# Patient Record
Sex: Female | Born: 1960 | Race: White | Hispanic: No | Marital: Married
Health system: Southern US, Academic
[De-identification: ages and names within clinical notes are randomized; demographics above are authoritative.]

## PROBLEM LIST (undated history)

## (undated) ENCOUNTER — Encounter

## (undated) ENCOUNTER — Ambulatory Visit

## (undated) ENCOUNTER — Ambulatory Visit: Payer: MEDICARE

## (undated) ENCOUNTER — Telehealth

## (undated) ENCOUNTER — Encounter: Attending: Anesthesiology | Primary: Anesthesiology

## (undated) ENCOUNTER — Non-Acute Institutional Stay: Payer: MEDICARE

## (undated) ENCOUNTER — Encounter: Attending: Obstetrics & Gynecology | Primary: Obstetrics & Gynecology

## (undated) ENCOUNTER — Ambulatory Visit: Payer: Medicare (Managed Care)

## (undated) DIAGNOSIS — K589 Irritable bowel syndrome without diarrhea: Secondary | ICD-10-CM

## (undated) DIAGNOSIS — C801 Malignant (primary) neoplasm, unspecified: Secondary | ICD-10-CM

## (undated) DIAGNOSIS — J45909 Unspecified asthma, uncomplicated: Secondary | ICD-10-CM

## (undated) DIAGNOSIS — E538 Deficiency of other specified B group vitamins: Secondary | ICD-10-CM

## (undated) DIAGNOSIS — Q798 Other congenital malformations of musculoskeletal system: Secondary | ICD-10-CM

## (undated) DIAGNOSIS — L309 Dermatitis, unspecified: Secondary | ICD-10-CM

## (undated) DIAGNOSIS — R739 Hyperglycemia, unspecified: Secondary | ICD-10-CM

## (undated) DIAGNOSIS — C539 Malignant neoplasm of cervix uteri, unspecified: Secondary | ICD-10-CM

## (undated) DIAGNOSIS — M549 Dorsalgia, unspecified: Secondary | ICD-10-CM

## (undated) DIAGNOSIS — M797 Fibromyalgia: Secondary | ICD-10-CM

## (undated) DIAGNOSIS — M1990 Unspecified osteoarthritis, unspecified site: Secondary | ICD-10-CM

## (undated) DIAGNOSIS — G8929 Other chronic pain: Secondary | ICD-10-CM

## (undated) HISTORY — DX: Irritable bowel syndrome without diarrhea: K58.9

## (undated) HISTORY — PX: BACK SURGERY: SHX140

## (undated) HISTORY — DX: Dermatitis, unspecified: L30.9

## (undated) HISTORY — PX: BREAST SURGERY: SHX581

## (undated) HISTORY — DX: Other chronic pain: G89.29

## (undated) HISTORY — DX: Deficiency of other specified B group vitamins: E53.8

## (undated) HISTORY — DX: Hyperglycemia, unspecified: R73.9

## (undated) HISTORY — DX: Malignant neoplasm of cervix uteri, unspecified: C53.9

## (undated) HISTORY — DX: Dorsalgia, unspecified: M54.9

## (undated) HISTORY — DX: Unspecified asthma, uncomplicated: J45.909

---
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
## 1998-03-05 ENCOUNTER — Other Ambulatory Visit: Admission: RE | Admit: 1998-03-05 | Discharge: 1998-03-05 | Payer: Self-pay | Admitting: Obstetrics and Gynecology

## 1998-11-10 ENCOUNTER — Ambulatory Visit (HOSPITAL_COMMUNITY): Admission: RE | Admit: 1998-11-10 | Discharge: 1998-11-10 | Payer: Self-pay | Admitting: Neurosurgery

## 1998-11-10 ENCOUNTER — Encounter: Payer: Self-pay | Admitting: Neurosurgery

## 1998-11-24 ENCOUNTER — Ambulatory Visit (HOSPITAL_COMMUNITY): Admission: RE | Admit: 1998-11-24 | Discharge: 1998-11-24 | Payer: Self-pay | Admitting: Neurosurgery

## 1998-11-24 ENCOUNTER — Encounter: Payer: Self-pay | Admitting: Neurosurgery

## 1998-12-08 ENCOUNTER — Encounter: Payer: Self-pay | Admitting: Neurosurgery

## 1998-12-08 ENCOUNTER — Ambulatory Visit (HOSPITAL_COMMUNITY): Admission: RE | Admit: 1998-12-08 | Discharge: 1998-12-08 | Payer: Self-pay | Admitting: Neurosurgery

## 1998-12-17 ENCOUNTER — Encounter: Payer: Self-pay | Admitting: Neurosurgery

## 1998-12-19 ENCOUNTER — Inpatient Hospital Stay (HOSPITAL_COMMUNITY): Admission: RE | Admit: 1998-12-19 | Discharge: 1998-12-20 | Payer: Self-pay | Admitting: Neurosurgery

## 1998-12-19 ENCOUNTER — Encounter: Payer: Self-pay | Admitting: Neurosurgery

## 1999-03-12 ENCOUNTER — Other Ambulatory Visit: Admission: RE | Admit: 1999-03-12 | Discharge: 1999-03-12 | Payer: Self-pay | Admitting: Obstetrics and Gynecology

## 1999-07-26 ENCOUNTER — Ambulatory Visit (HOSPITAL_COMMUNITY): Admission: RE | Admit: 1999-07-26 | Discharge: 1999-07-26 | Payer: Self-pay | Admitting: Neurosurgery

## 1999-07-26 ENCOUNTER — Encounter: Payer: Self-pay | Admitting: Neurosurgery

## 2000-03-07 ENCOUNTER — Encounter: Payer: Self-pay | Admitting: Neurosurgery

## 2000-03-07 ENCOUNTER — Ambulatory Visit (HOSPITAL_COMMUNITY): Admission: RE | Admit: 2000-03-07 | Discharge: 2000-03-07 | Payer: Self-pay | Admitting: Neurosurgery

## 2000-03-23 ENCOUNTER — Ambulatory Visit (HOSPITAL_COMMUNITY): Admission: RE | Admit: 2000-03-23 | Discharge: 2000-03-23 | Payer: Self-pay | Admitting: Neurosurgery

## 2000-03-23 ENCOUNTER — Encounter: Payer: Self-pay | Admitting: Neurosurgery

## 2000-04-07 ENCOUNTER — Encounter: Payer: Self-pay | Admitting: Neurosurgery

## 2000-04-07 ENCOUNTER — Ambulatory Visit (HOSPITAL_COMMUNITY): Admission: RE | Admit: 2000-04-07 | Discharge: 2000-04-07 | Payer: Self-pay | Admitting: Neurosurgery

## 2000-04-11 ENCOUNTER — Other Ambulatory Visit: Admission: RE | Admit: 2000-04-11 | Discharge: 2000-04-11 | Payer: Self-pay | Admitting: Obstetrics and Gynecology

## 2000-06-10 ENCOUNTER — Encounter: Admission: RE | Admit: 2000-06-10 | Discharge: 2000-06-10 | Payer: Self-pay | Admitting: Obstetrics and Gynecology

## 2000-06-10 ENCOUNTER — Encounter: Payer: Self-pay | Admitting: Obstetrics and Gynecology

## 2000-08-25 ENCOUNTER — Encounter (INDEPENDENT_AMBULATORY_CARE_PROVIDER_SITE_OTHER): Payer: Self-pay | Admitting: Specialist

## 2000-08-25 ENCOUNTER — Ambulatory Visit (HOSPITAL_COMMUNITY): Admission: RE | Admit: 2000-08-25 | Discharge: 2000-08-25 | Payer: Self-pay | Admitting: Obstetrics and Gynecology

## 2001-07-04 ENCOUNTER — Other Ambulatory Visit: Admission: RE | Admit: 2001-07-04 | Discharge: 2001-07-04 | Payer: Self-pay | Admitting: Obstetrics and Gynecology

## 2001-09-08 ENCOUNTER — Encounter: Admission: RE | Admit: 2001-09-08 | Discharge: 2001-09-08 | Payer: Self-pay | Admitting: Urology

## 2001-09-08 ENCOUNTER — Encounter: Payer: Self-pay | Admitting: Urology

## 2001-09-12 ENCOUNTER — Encounter: Payer: Self-pay | Admitting: Urology

## 2001-09-12 ENCOUNTER — Encounter: Admission: RE | Admit: 2001-09-12 | Discharge: 2001-09-12 | Payer: Self-pay | Admitting: Urology

## 2001-09-22 ENCOUNTER — Ambulatory Visit (HOSPITAL_BASED_OUTPATIENT_CLINIC_OR_DEPARTMENT_OTHER): Admission: RE | Admit: 2001-09-22 | Discharge: 2001-09-22 | Payer: Self-pay | Admitting: Urology

## 2001-09-22 ENCOUNTER — Encounter: Payer: Self-pay | Admitting: Urology

## 2001-11-17 ENCOUNTER — Ambulatory Visit (HOSPITAL_COMMUNITY): Admission: RE | Admit: 2001-11-17 | Discharge: 2001-11-17 | Payer: Self-pay | Admitting: Family Medicine

## 2001-11-17 ENCOUNTER — Encounter: Payer: Self-pay | Admitting: Family Medicine

## 2002-02-16 ENCOUNTER — Ambulatory Visit (HOSPITAL_COMMUNITY): Admission: RE | Admit: 2002-02-16 | Discharge: 2002-02-16 | Payer: Self-pay | Admitting: Unknown Physician Specialty

## 2002-02-16 ENCOUNTER — Encounter: Payer: Self-pay | Admitting: Family Medicine

## 2002-02-27 ENCOUNTER — Ambulatory Visit (HOSPITAL_COMMUNITY): Admission: RE | Admit: 2002-02-27 | Discharge: 2002-02-27 | Payer: Self-pay | Admitting: Family Medicine

## 2002-02-27 ENCOUNTER — Encounter: Payer: Self-pay | Admitting: Family Medicine

## 2006-06-22 ENCOUNTER — Emergency Department (HOSPITAL_COMMUNITY): Admission: EM | Admit: 2006-06-22 | Discharge: 2006-06-22 | Payer: Self-pay | Admitting: Emergency Medicine

## 2007-12-03 ENCOUNTER — Emergency Department (HOSPITAL_COMMUNITY): Admission: EM | Admit: 2007-12-03 | Discharge: 2007-12-04 | Payer: Self-pay | Admitting: Emergency Medicine

## 2009-01-07 IMAGING — CR DG ABDOMEN ACUTE W/ 1V CHEST
3 series · 3 of 3 positions shown · non-contrast
Comparison: Chest 06/22/2006

CLINICAL DATA: *Abdominal pain;

ACUTE ABDOMEN SERIES (ABDOMEN 2 VIEW & CHEST 1 VIEW)

[view not recorded (1 of 3)]
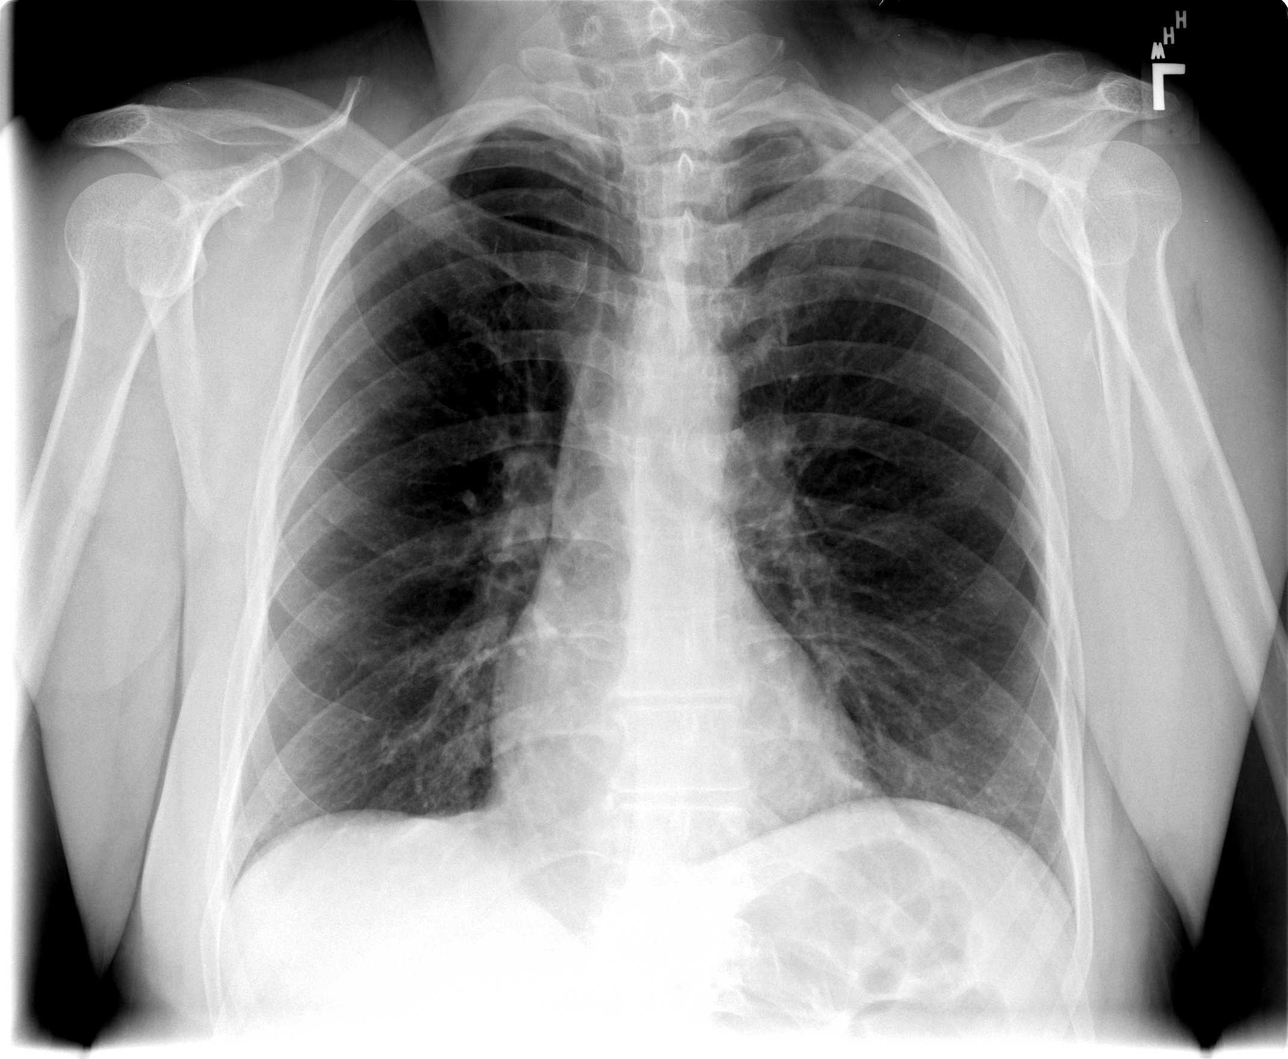

[view not recorded (2 of 3)]
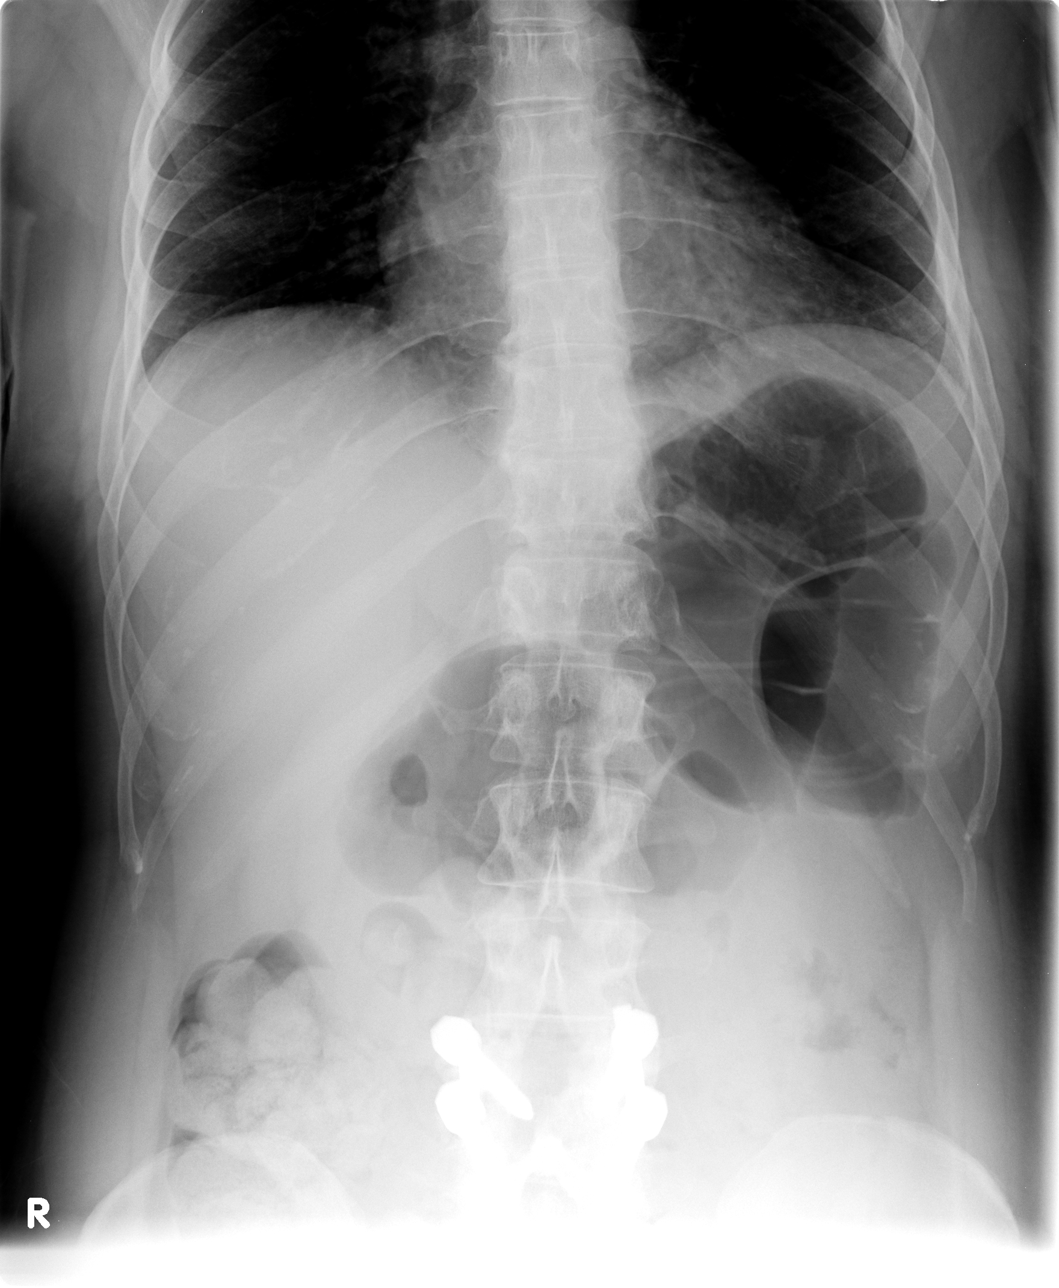

[view not recorded (3 of 3)]
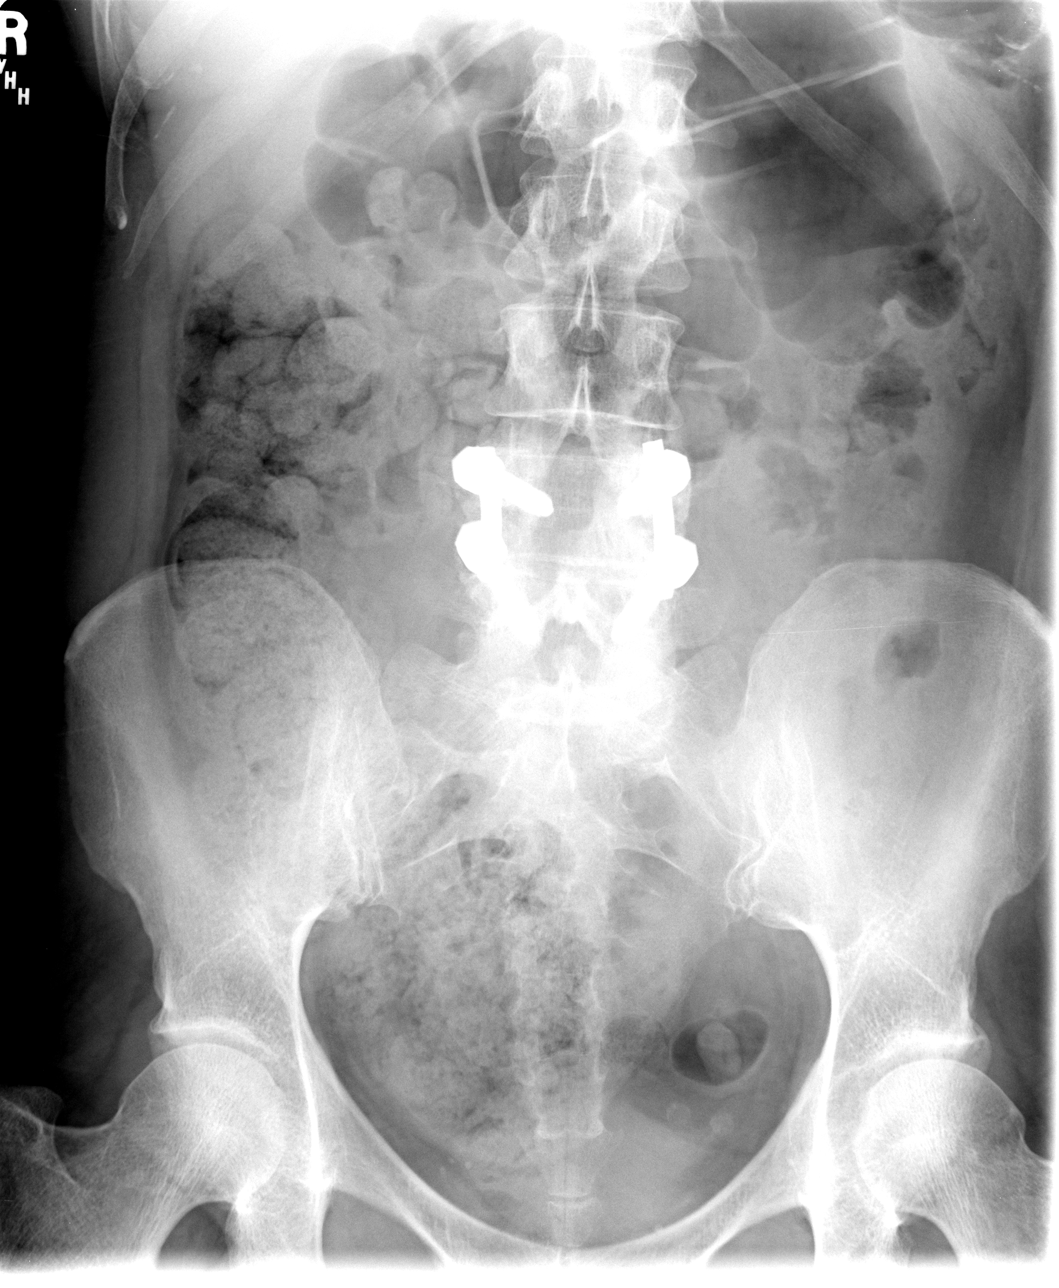

[3 of 3 positions shown; findings below may reference images not displayed]

FINDINGS: The frontal chest film is clear.  Supine and erect
abdomen films show no free air.  Spinal fixation hardware L4-5.
Small bowel decompressed.  Moderate fecal material distends the
cecum and ascending colon, and there is mild gaseous distension of
the splenic flexure; the distal colon appears decompressed.
Bilateral pelvic vascular calcifications.
IMPRESSION: 1.  Moderate proximal colonic fecal material, with a nonobstructive
bowel gas pattern.

## 2010-02-19 ENCOUNTER — Encounter (INDEPENDENT_AMBULATORY_CARE_PROVIDER_SITE_OTHER): Payer: Self-pay | Admitting: *Deleted

## 2010-10-03 ENCOUNTER — Encounter: Payer: Self-pay | Admitting: Family Medicine

## 2010-10-04 ENCOUNTER — Encounter: Payer: Self-pay | Admitting: Family Medicine

## 2010-10-13 NOTE — Letter (Signed)
Summary: New Patient letter  [facility]  [address] [place], [place] [address]   Phone: [phone]  Fax: [phone]       02/19/2010 MRN: [id number]  [facility] [address] [facility] [address] [place], [place]  [address]  Dear [name] [name],  Welcome to the Gastroenterology Division at [facility].    You are scheduled to see Dr. Jarold Motto on 03/24/2010 at 2:00PM on the 3rd floor at [facility], [address]  We ask that you try to arrive at our office 15 minutes prior to your appointment time to allow for check-in.  We would like you to complete the enclosed self-administered evaluation form prior to your visit and bring it with you on the day of your appointment.  We will review it with you.  Also, please bring a complete list of all your medications or, if you prefer, bring the medication bottles and we will list them.  Please bring your insurance card so that we may make a copy of it.  If your insurance requires a referral to see a specialist, please bring your referral form from your primary care physician.  Co-payments are due at the time of your visit and may be paid by cash, check or credit card.     Your office visit will consist of a consult with your physician (includes a physical exam), any laboratory testing he/she may order, scheduling of any necessary diagnostic testing (e.g. x-ray, ultrasound, CT-scan), and scheduling of a procedure (e.g. Endoscopy, Colonoscopy) if required.  Please allow enough time on your schedule to allow for any/all of these possibilities.    If you cannot keep your appointment, please call [phone] to cancel or reschedule prior to your appointment date.  This allows [place] the opportunity to schedule an appointment for another patient in need of care.  If you do not cancel or reschedule by 5 p.m. the business day prior to your appointment date, you will be charged a $50.00 late cancellation/no-show fee.    Thank you for  choosing [facility] for your medical needs.  We appreciate the opportunity to care for you.  Please visit [place] at our website  to learn more about our practice.                     Sincerely,                                                             The Gastroenterology Division

## 2011-01-29 NOTE — Op Note (Signed)
[facility]  Patient:    [name] [name], [name] [name] Visit Number: [id number] MRN: [id number]          Service Type: NES Location: [facility] Attending Physician:  Londell Moh Dictated by:   Jamison Neighbor, M.D. Proc. Date: 09/22/01 Admit Date:  09/22/2001   CC:         Lilyan Punt, M.D. in [place], [place]   Operative Report  PREOPERATIVE DIAGNOSIS:  Recurrent urinary tract infection with associated chronic back pain.  POSTOPERATIVE DIAGNOSIS:  Recurrent urinary tract infection with associated chronic back pain.  PROCEDURE: 1. Cystoscopy. 2. Bilateral retrogrades. 3. Left flexible ureteroscope and left Double J catheter insertion.  SURGEON:  Jamison Neighbor, M.D.  ANESTHESIA:  General.  COMPLICATIONS:  None.  DRAINS:  A 4.5-French Double J catheter.  BRIEF HISTORY:  This 50 year old female has had problems with urgency, frequency, low back pain in what has been thought to be recurrent urinary tract infections. Given the patients history of multiple illnesses, it was certainly thought that this might not be a true chronic cystitis but perhaps interstitial cystitis, but she has had several positive cultures and it is felt that she needs to be worked up for recurrent infection.  Cystoscopy was performed in the office. It was unremarkable. A renal ultrasound was obtained which showed what appeared to be a possible stone in the upper pole of the kidney. A KUB did not show a stone and a CT scan did not show a stone. The patient is very concerned about this finding and has requested that she undergo retrograde studies. The patient will also undergo a left flexible ureteroscopy. The patient understands the risks and benefits of the procedure and gave full and informed consent.  DESCRIPTION OF PROCEDURE:  After successful induction of general anesthesia, the patient was placed in the dorsal lithotomy position, prepped with Betadine and draped in the  usual sterile fashion. A careful bimanual examination revealed no palpable abnormalities of the urethra. There was no real cystocele, rectocele, or enterocele. There were no masses on bimanual exam. Cystoscope was inserted. The bladder was carefully inspected. It was free of any tumor or stones. Both ureteral orifices were normal in configuration and location. Right retrograde study was done. There was a completely normal collecting system with no sign of filling defect. There was no evidence of obstruction and the drain _______ were normal. Retrograde study performed on the left side was also normal. Given the findings described above, however, it was felt that a flexible ureteroscopy would be appropriate. The guidewire was passed up into the kidney and a ureteral access sheath was passed over the guidewire. The flexible ureteroscope was then passed through the ureteral access sheath up into the upper pole. The upper pole was carefully inspected multiple times and no stone was ever visualized. The mid pole and lower pole were also visualized with all portions of the collecting system easily seen. No stones could be seen. There were no tumors or other abnormalities. The guidewire was left in place, the access sheath was removed, and a 4.5-French Double J was then passed up into the kidney and allowed to coil normally in the pelvis. This was left with a string so that the patient can remove this on her own. The patient already has MS Contin so she will not require and additional pain medication. She will be sent home with Pyridium Plus as well as antibiotic prophylaxis with Septra DS one daily. A B&O suppository was given and  the patient was given an injection of lidocaine jelly into the urethra. The patient will be given Zofran and Toradol prior to discharge. Dictated by:   Jamison Neighbor, M.D. Attending Physician:  Londell Moh DD:  09/22/01 TD:  09/23/01 Job:  [id number]

## 2011-01-29 NOTE — H&P (Signed)
[facility]  Patient:    [name] [name]                          MRN: [id number] Adm. Date:  08/25/00 Attending:  Lenoard Aden, M.D.                         History and Physical  CHIEF COMPLAINT:              Dysfunctional uterine bleeding with abnormal saline sonohysterography and desire for elective sterilization.  HISTORY OF PRESENT ILLNESS:   Patient is a 50 year old white female, G4, P2, LMP abnormal, with a history of an abnormal saline sonohysterogram with questionable endometrial polyp, who presents for [facility], hysteroscopy and desire for elective sterilization.  PAST MEDICAL HISTORY:         Past medical history remarkable for breast surgery and saline implants with rupture, history of laser surgery in 1989 and history of two uncomplicated vaginal deliveries and two abortions.  Patient has had multiple surgeries for fixing her saline implants.  History of scarlet fever as a child and occasional asthma.  FAMILY HISTORY:               Family history of hypertension, stomach and throat cancer and heart disease.  SOCIAL HISTORY:               She is a smoker, a pack a day for 15 years.  She denies domestic or physical violence.  MEDICATIONS:                  Celebrex, Baclofen, Zanaflex, Topamax, Lasix, doxycycline, Prevacid, Micronor, B12 injections, Percocet p.r.n., Flonase, Ventolin and Flovent.  ALLERGIES:                    She is allergic to AUGMENTIN and LATEX.  PHYSICAL EXAMINATION  GENERAL:                      She is a thin white female in no apparent distress.  HEENT:                        Normal.  LUNGS:                        Clear.  HEART:                        Regular rhythm.  ABDOMEN:                      Soft, nontender.  PELVIC:                       Exam reveals a uterus which is normal size and anteflexed and no adnexal masses.  IMPRESSION:                   1. Dysfunctional uterine bleeding with defect  on                                  saline sonohysterography.  2. Desire for elective sterilization.  PLAN:                         Plan is to proceed with D&C, hysteroscopy, resectoscope, laparoscopic tubal ligation.  Risks of anesthesia, infection, bleeding, injury to abdominal organs, uterine perforation with need for repair, delayed versus immediate complications to include bowel and bladder injury are noted; patient acknowledges and desires to proceed.  Failure risk of tubal ligation of 5 to 10 per thousand noted. DD:  08/25/00 TD:  08/25/00 Job: [id number]

## 2011-06-07 LAB — URINALYSIS, ROUTINE W REFLEX MICROSCOPIC
Bilirubin Urine: NEGATIVE
Glucose, UA: NEGATIVE
Leukocytes, UA: NEGATIVE
Nitrite: NEGATIVE
Protein, ur: NEGATIVE
Specific Gravity, Urine: >1.03 — ABNORMAL HIGH
Urobilinogen, UA: 0.2
pH: 5

## 2011-06-07 LAB — URINE MICROSCOPIC-ADD ON

## 2011-09-05 ENCOUNTER — Encounter: Payer: Self-pay | Admitting: *Deleted

## 2011-09-05 DIAGNOSIS — Q798 Other congenital malformations of musculoskeletal system: Secondary | ICD-10-CM | POA: Insufficient documentation

## 2011-09-05 DIAGNOSIS — Z859 Personal history of malignant neoplasm, unspecified: Secondary | ICD-10-CM | POA: Insufficient documentation

## 2011-09-05 DIAGNOSIS — J189 Pneumonia, unspecified organism: Secondary | ICD-10-CM | POA: Insufficient documentation

## 2011-09-05 DIAGNOSIS — F17200 Nicotine dependence, unspecified, uncomplicated: Secondary | ICD-10-CM | POA: Insufficient documentation

## 2011-09-05 NOTE — ED Notes (Signed)
Pt has had cough for 5wks. Pt states she vomited 1 time on Monday, Dec. 17th. Pt initially coughing up mucous, now pt states she is coughing up blood tinged mucous.

## 2011-09-06 ENCOUNTER — Encounter (HOSPITAL_COMMUNITY): Payer: Self-pay | Admitting: Emergency Medicine

## 2011-09-06 ENCOUNTER — Emergency Department (HOSPITAL_COMMUNITY)
Admission: EM | Admit: 2011-09-06 | Discharge: 2011-09-06 | Disposition: A | Discharge status: Home / Self Care | Payer: PRIVATE HEALTH INSURANCE | Attending: Emergency Medicine | Admitting: Emergency Medicine

## 2011-09-06 ENCOUNTER — Emergency Department (HOSPITAL_COMMUNITY): Payer: PRIVATE HEALTH INSURANCE

## 2011-09-06 DIAGNOSIS — J189 Pneumonia, unspecified organism: Secondary | ICD-10-CM

## 2011-09-06 HISTORY — DX: Malignant (primary) neoplasm, unspecified: C80.1

## 2011-09-06 HISTORY — DX: Other congenital malformations of musculoskeletal system: Q79.8

## 2011-09-06 MED ORDER — PREDNISONE 20 MG PO TABS
60.0000 mg | ORAL_TABLET | Freq: Once | ORAL | Status: AC
  Administered 2011-09-06: 60 mg via ORAL
  Filled 2011-09-06: qty 3

## 2011-09-06 MED ORDER — ALBUTEROL SULFATE HFA 108 (90 BASE) MCG/ACT IN AERS
2.0000 | INHALATION_SPRAY | RESPIRATORY_TRACT | Status: DC | PRN
  Administered 2011-09-06: 2 via RESPIRATORY_TRACT
  Filled 2011-09-06: qty 6.7

## 2011-09-06 MED ORDER — DOXYCYCLINE HYCLATE 100 MG PO TABS
100.0000 mg | ORAL_TABLET | Freq: Once | ORAL | Status: AC
  Administered 2011-09-06: 100 mg via ORAL
  Filled 2011-09-06: qty 1

## 2011-09-06 MED ORDER — PREDNISONE 20 MG PO TABS: 40.0000 mg | ORAL_TABLET | Freq: Every day | ORAL | Status: AC

## 2011-09-06 MED ORDER — DOXYCYCLINE HYCLATE 100 MG PO TABS: 100.0000 mg | ORAL_TABLET | Freq: Two times a day (BID) | ORAL | Status: AC

## 2011-09-06 NOTE — ED Notes (Signed)
Entered pt room, she was found in the parking lot, looking for her im oxycodone, stated thinks she lost it. She is unable to find, stated she was out to eat and may have lost in the restaurant.  Pt was looking under chairs in waiting room, and was unable to locate

## 2011-09-06 NOTE — ED Notes (Signed)
Patient states she has not coughed up any blood while in the ED.

## 2011-09-06 NOTE — ED Provider Notes (Signed)
History     CSN: [id number]  Arrival date & time 09/05/11  2312   First MD Initiated Contact with Patient 09/06/11 0109      Chief Complaint  Patient presents with  . Hematemesis    (Consider location/radiation/quality/duration/timing/severity/associated sxs/prior treatment) HPI Comments: The patient is a 50 year old female who is a smoker and presents for evaluation of a cough that has been present for 5 weeks and only this week approximately 5 days ago did it become productive with sputum streaked with blood and progressively more blood streaking in the sputum across the week. She denies any dyspnea or wheezing, and she denies any fevers. She reports that the cough has worsened and become "harder" over this past week. She also reports nasal congestion. She had one episode of vomiting on December 17 but otherwise has had no nausea, vomiting, or diarrhea, and she denies any chest pain. She denies any recent weight loss or lymphadenopathy.  Patient is a 51 y.o. female presenting with cough. The history is provided by the patient.  Cough This is a new problem. Episode onset: 5 weeks ago. The problem occurs every few minutes. The problem has been gradually worsening. The cough is productive of blood-tinged sputum. There has been no fever. Associated symptoms include rhinorrhea. Pertinent negatives include no chest pain, no chills, no sweats, no weight loss, no ear congestion, no ear pain, no headaches, no sore throat, no myalgias, no shortness of breath, no wheezing and no eye redness. She has tried nothing for the symptoms. She is a smoker.    Past Medical History  Diagnosis Date  . Cancer   . Poland's syndrome     Past Surgical History  Procedure Date  . Back surgery   . Breast surgery     History reviewed. No pertinent family history.  History  Substance Use Topics  . Smoking status: Current Everyday Smoker  . Smokeless tobacco: Not on file  . Alcohol Use: No    OB History     Grav Para Term Preterm Abortions TAB SAB Ect Mult Living                  Review of Systems  Constitutional: Negative for fever, chills, weight loss, diaphoresis, activity change, appetite change, fatigue and unexpected weight change.  HENT: Positive for rhinorrhea. Negative for hearing loss, ear pain, nosebleeds, congestion, sore throat, mouth sores, neck pain, neck stiffness, dental problem, postnasal drip and ear discharge.   Eyes: Negative for photophobia, pain, discharge and redness.  Respiratory: Positive for cough. Negative for choking, chest tightness, shortness of breath, wheezing and stridor.   Cardiovascular: Negative for chest pain, palpitations and leg swelling.  Gastrointestinal: Negative for nausea, vomiting, abdominal pain and diarrhea.  Genitourinary: Negative for dysuria and flank pain.  Musculoskeletal: Negative for myalgias.  Skin: Negative for color change, pallor, rash and wound.  Neurological: Negative for dizziness, syncope, weakness, light-headedness, numbness and headaches.  Hematological: Negative for adenopathy.  Psychiatric/Behavioral: Negative.     Allergies  Augmentin; Latex; and Vioxx  Home Medications   Current Outpatient Rx  Name Route Sig Dispense Refill  . VITAMIN B-12 IJ Injection Inject as directed.      . LUBIPROSTONE 8 MCG PO CAPS Oral Take 8 mcg by mouth 2 (two) times daily with a meal.      . OXYCODONE HCL ER 80 MG PO TB12 Oral Take 80 mg by mouth every 12 (twelve) hours.      . OXYCODONE HCL 15  MG PO TABS Oral Take 15 mg by mouth every 4 (four) hours as needed.        BP 120/68  Pulse 69  Temp 97.8 F (36.6 C)  Resp 20  Ht 5' 9.5" (1.765 m)  Wt 165 lb (74.844 kg)  BMI 24.02 kg/m2  SpO2 97%  LMP 08/22/2011  Physical Exam  Nursing note and vitals reviewed. Constitutional: She is oriented to person, place, and time. She appears well-developed and well-nourished. No distress.  HENT:  Head: Normocephalic and atraumatic.  Right  Ear: External ear normal.  Left Ear: External ear normal.  Nose: Nose normal.  Mouth/Throat: Oropharynx is clear and moist.  Eyes: Conjunctivae and EOM are normal. Pupils are equal, round, and reactive to light.  Neck: Normal range of motion. Neck supple. No JVD present. No tracheal deviation present.  Cardiovascular: Normal rate, regular rhythm, normal heart sounds and intact distal pulses.  Exam reveals no gallop and no friction rub.   No murmur heard. Pulmonary/Chest: Effort normal. No accessory muscle usage or stridor. Not tachypneic. No respiratory distress. She has no decreased breath sounds. She has no wheezes. She has no rhonchi. She has no rales. She exhibits no tenderness.  Abdominal: Soft. Bowel sounds are normal. She exhibits no distension. There is no tenderness. There is no rebound and no guarding.  Musculoskeletal: Normal range of motion. She exhibits no edema and no tenderness.  Lymphadenopathy:       Head (right side): No submental, no submandibular and no preauricular adenopathy present.       Head (left side): No submental, no submandibular and no preauricular adenopathy present.    She has no cervical adenopathy.       Right cervical: No superficial cervical, no deep cervical and no posterior cervical adenopathy present.      Left cervical: No superficial cervical, no deep cervical and no posterior cervical adenopathy present.       Right: No supraclavicular adenopathy present.       Left: No supraclavicular adenopathy present.  Neurological: She is alert and oriented to person, place, and time. She has normal reflexes. No cranial nerve deficit. She exhibits normal muscle tone. Coordination normal.  Skin: Skin is warm and dry. No rash noted. She is not diaphoretic. No erythema. No pallor.  Psychiatric: She has a normal mood and affect. Her behavior is normal. Judgment and thought content normal.    ED Course  Procedures (including critical care time)  Labs Reviewed - No  data to display Dg Chest 2 View  09/06/2011  *RADIOLOGY REPORT*  Clinical Data: Worsening productive cough, hemoptysis  CHEST - 2 VIEW  Comparison: 06/22/2006  Findings: Normal heart size, mediastinal contours, and pulmonary vascularity. Emphysematous bronchitic changes. Right middle lobe consolidation consistent with pneumonia. No pleural effusion or pneumothorax. Bones appear mildly demineralized.  IMPRESSION: Emphysematous and bronchitic changes compatible with COPD. Right middle lobe pneumonia.  Original Report Authenticated By: Lollie Marrow, M.D.     No diagnosis found.    MDM  The patient has an apparent right middle lobe pneumonia, explaining her symptoms. Otherwise, I do perceive a component of bronchitis and I will treat the patient in addition to antibiotics, with steroid and bronchodilator for relief of symptoms, relaxation of the airways, relief of cough, and reduction of airway inflammation. There is no apparent lung cancer seen on the chest x-ray, but followup evaluation with her primary care physician will be needed to assure that the hemoptysis has resolved with the  pneumonia. I explained this to the patient who states her understanding.        Felisa Bonier, MD 09/06/11 [phone]

## 2011-09-13 ENCOUNTER — Other Ambulatory Visit: Payer: Self-pay | Admitting: Family Medicine

## 2011-09-13 DIAGNOSIS — F17200 Nicotine dependence, unspecified, uncomplicated: Secondary | ICD-10-CM

## 2011-09-13 DIAGNOSIS — J189 Pneumonia, unspecified organism: Secondary | ICD-10-CM

## 2011-09-27 ENCOUNTER — Ambulatory Visit (HOSPITAL_COMMUNITY)
Admission: RE | Admit: 2011-09-27 | Discharge: 2011-09-27 | Disposition: A | Discharge status: Home / Self Care | Payer: PRIVATE HEALTH INSURANCE | Source: Ambulatory Visit | Attending: Family Medicine | Admitting: Family Medicine

## 2011-09-27 ENCOUNTER — Other Ambulatory Visit: Payer: Self-pay | Admitting: Family Medicine

## 2011-09-27 DIAGNOSIS — F17200 Nicotine dependence, unspecified, uncomplicated: Secondary | ICD-10-CM

## 2011-09-27 DIAGNOSIS — R05 Cough: Secondary | ICD-10-CM | POA: Insufficient documentation

## 2011-09-27 DIAGNOSIS — J189 Pneumonia, unspecified organism: Secondary | ICD-10-CM | POA: Insufficient documentation

## 2011-09-27 DIAGNOSIS — R059 Cough, unspecified: Secondary | ICD-10-CM | POA: Insufficient documentation

## 2011-09-27 MED ORDER — IOHEXOL 300 MG/ML  SOLN
100.0000 mL | Freq: Once | INTRAMUSCULAR | Status: AC | PRN
  Administered 2011-09-27: 100 mL via INTRAVENOUS

## 2011-11-09 ENCOUNTER — Ambulatory Visit (HOSPITAL_COMMUNITY): Payer: PRIVATE HEALTH INSURANCE

## 2011-11-10 ENCOUNTER — Other Ambulatory Visit: Payer: Self-pay | Admitting: Family Medicine

## 2011-11-10 DIAGNOSIS — R911 Solitary pulmonary nodule: Secondary | ICD-10-CM

## 2011-11-16 ENCOUNTER — Ambulatory Visit (HOSPITAL_COMMUNITY): Payer: PRIVATE HEALTH INSURANCE

## 2011-11-23 ENCOUNTER — Ambulatory Visit (HOSPITAL_COMMUNITY)
Admission: RE | Admit: 2011-11-23 | Discharge: 2011-11-23 | Payer: PRIVATE HEALTH INSURANCE | Source: Ambulatory Visit | Attending: Family Medicine | Admitting: Family Medicine

## 2011-12-23 ENCOUNTER — Ambulatory Visit (HOSPITAL_COMMUNITY): Payer: PRIVATE HEALTH INSURANCE

## 2011-12-27 ENCOUNTER — Ambulatory Visit (HOSPITAL_COMMUNITY): Admission: RE | Admit: 2011-12-27 | Payer: PRIVATE HEALTH INSURANCE | Source: Ambulatory Visit

## 2012-01-04 ENCOUNTER — Ambulatory Visit (HOSPITAL_COMMUNITY): Payer: PRIVATE HEALTH INSURANCE

## 2012-03-21 ENCOUNTER — Other Ambulatory Visit: Payer: Self-pay | Admitting: Family Medicine

## 2012-03-21 DIAGNOSIS — R9389 Abnormal findings on diagnostic imaging of other specified body structures: Secondary | ICD-10-CM

## 2012-03-28 ENCOUNTER — Ambulatory Visit (HOSPITAL_COMMUNITY): Admission: RE | Admit: 2012-03-28 | Payer: PRIVATE HEALTH INSURANCE | Source: Ambulatory Visit

## 2012-06-14 ENCOUNTER — Encounter (HOSPITAL_COMMUNITY): Payer: Self-pay

## 2012-06-14 ENCOUNTER — Emergency Department (HOSPITAL_COMMUNITY)
Admission: EM | Admit: 2012-06-14 | Discharge: 2012-06-14 | Disposition: A | Discharge status: Home / Self Care | Payer: PRIVATE HEALTH INSURANCE | Attending: Emergency Medicine | Admitting: Emergency Medicine

## 2012-06-14 DIAGNOSIS — F17200 Nicotine dependence, unspecified, uncomplicated: Secondary | ICD-10-CM | POA: Insufficient documentation

## 2012-06-14 DIAGNOSIS — N949 Unspecified condition associated with female genital organs and menstrual cycle: Secondary | ICD-10-CM | POA: Insufficient documentation

## 2012-06-14 DIAGNOSIS — N938 Other specified abnormal uterine and vaginal bleeding: Secondary | ICD-10-CM | POA: Insufficient documentation

## 2012-06-14 DIAGNOSIS — Z859 Personal history of malignant neoplasm, unspecified: Secondary | ICD-10-CM | POA: Insufficient documentation

## 2012-06-14 LAB — BASIC METABOLIC PANEL
BUN: 17 mg/dL (ref 6–23)
CO2: 26 mEq/L (ref 19–32)
Chloride: 99 mEq/L (ref 96–112)
Creatinine, Ser: 0.68 mg/dL (ref 0.50–1.10)

## 2012-06-14 LAB — CBC
HCT: 38.4 % (ref 36.0–46.0)
MCH: 32.1 pg (ref 26.0–34.0)
MCV: 92.8 fL (ref 78.0–100.0)
RBC: 4.14 MIL/uL (ref 3.87–5.11)
WBC: 10.9 10*3/uL — ABNORMAL HIGH (ref 4.0–10.5)

## 2012-06-14 NOTE — ED Provider Notes (Signed)
History     CSN: [id number]  Arrival date & time 06/14/12  0220   First MD Initiated Contact with Patient 06/14/12 0248      Chief Complaint  Patient presents with  . Vaginal Bleeding     HPI The patient reports normal 28 day menstrual cycles.  She however presents the emergency department today because of 3 days of abnormal vaginal bleeding with some worsening bleeding this evening including development of clots from her vagina.  She denies lightheadedness or weakness.  She reports she finished a normal menstrual period one week ago and then began having bleeding between cycles that began 3 days ago.  She denies use of anticoagulants.  She's never had anything happen like this before.  She reports some lower abdominal cramping.she denies fevers or chills.  She has a gynecologist in [place] but has not scheduled an appointment.   Past Medical History  Diagnosis Date  . Cancer   . Poland's syndrome     Past Surgical History  Procedure Date  . Back surgery   . Breast surgery     No family history on file.  History  Substance Use Topics  . Smoking status: Current Every Day Smoker  . Smokeless tobacco: Not on file  . Alcohol Use: No    OB History    Grav Para Term Preterm Abortions TAB SAB Ect Mult Living                  Review of Systems  All other systems reviewed and are negative.    Allergies  Amoxicillin-pot clavulanate; Latex; and Vioxx  Home Medications   Current Outpatient Rx  Name Route Sig Dispense Refill  . VITAMIN B-12 IJ Injection Inject as directed.      . CYCLOBENZAPRINE HCL 10 MG PO TABS Oral Take 10 mg by mouth 3 (three) times daily as needed.    . IBUPROFEN 600 MG PO TABS Oral Take 600 mg by mouth every 6 (six) hours as needed.    . LUBIPROSTONE 8 MCG PO CAPS Oral Take 8 mcg by mouth 2 (two) times daily with a meal.      . OXYCODONE HCL ER 80 MG PO TB12 Oral Take 160 mg by mouth 3 (three) times daily.     . OXYCODONE HCL 15 MG PO TABS  Oral Take 15 mg by mouth every 4 (four) hours as needed.        BP 128/63  Pulse 65  Temp 98.3 F (36.8 C) (Oral)  Resp 18  Ht 5\' 9"  (1.753 m)  Wt 175 lb (79.379 kg)  BMI 25.84 kg/m2  SpO2 95%  LMP 05/28/2012  Physical Exam  Nursing note and vitals reviewed. Constitutional: She is oriented to person, place, and time. She appears well-developed and well-nourished. No distress.  HENT:  Head: Normocephalic and atraumatic.  Eyes: EOM are normal.  Neck: Normal range of motion.  Cardiovascular: Normal rate, regular rhythm and normal heart sounds.   Pulmonary/Chest: Effort normal and breath sounds normal.  Abdominal: Soft. She exhibits no distension. There is no tenderness.  Genitourinary:       Normal external genitalia.  One small vaginal blood clot noted.  Clot removed in a very small amount of vaginal bleeding noted from the cervical os.  There is no cervical lesions or masses noted.  Musculoskeletal: Normal range of motion.  Neurological: She is alert and oriented to person, place, and time.  Skin: Skin is warm and dry.  Psychiatric: She has a normal mood and affect. Judgment normal.    ED Course  Procedures (including critical care time)  Labs Reviewed  CBC - Abnormal; Notable for the following:    WBC 10.9 (*)     All other components within normal limits  BASIC METABOLIC PANEL - Abnormal; Notable for the following:    Sodium 134 (*)     Potassium 3.3 (*)     Glucose, Bld 143 (*)     All other components within normal limits  HCG, SERUM, QUALITATIVE   No results found.   1. DUB (dysfunctional uterine bleeding)       MDM  Dysfunctional uterine bleeding with normal vital signs and normal hemoglobin.  After pelvic exam the patient was watched for one hour and she reports only very minimal blood is noted on her fresh pad was placed after pelvic exam.  The patient has no abdominal pain at this time.  Discharge home with close follow up with her gynecologist.  She  understands to return to the [facility] hospital maternity assessment unit for any new or worsening symptoms.  Obviously if things drastically worsen she is to follow up at the [facility] emergency department        Lyanne Co, MD 06/14/12 [phone]

## 2012-06-14 NOTE — ED Notes (Signed)
Pt had a normal period a week ago, then with heavy bleeding that started 3 days ago.  Cramping to right lower abd

## 2012-07-18 ENCOUNTER — Other Ambulatory Visit: Payer: Self-pay | Admitting: Family Medicine

## 2012-07-18 DIAGNOSIS — N939 Abnormal uterine and vaginal bleeding, unspecified: Secondary | ICD-10-CM

## 2012-07-20 ENCOUNTER — Other Ambulatory Visit (HOSPITAL_COMMUNITY): Payer: PRIVATE HEALTH INSURANCE

## 2012-07-20 ENCOUNTER — Ambulatory Visit (HOSPITAL_COMMUNITY): Admission: RE | Admit: 2012-07-20 | Payer: PRIVATE HEALTH INSURANCE | Source: Ambulatory Visit

## 2012-08-01 ENCOUNTER — Encounter: Payer: Self-pay | Admitting: *Deleted

## 2012-08-02 ENCOUNTER — Ambulatory Visit: Payer: PRIVATE HEALTH INSURANCE | Admitting: Cardiology

## 2012-09-08 ENCOUNTER — Ambulatory Visit (HOSPITAL_COMMUNITY): Admission: RE | Admit: 2012-09-08 | Payer: PRIVATE HEALTH INSURANCE | Source: Ambulatory Visit

## 2012-09-08 ENCOUNTER — Other Ambulatory Visit: Payer: Self-pay | Admitting: Family Medicine

## 2012-09-08 DIAGNOSIS — N939 Abnormal uterine and vaginal bleeding, unspecified: Secondary | ICD-10-CM

## 2012-09-11 ENCOUNTER — Ambulatory Visit (HOSPITAL_COMMUNITY): Admission: RE | Admit: 2012-09-11 | Payer: PRIVATE HEALTH INSURANCE | Source: Ambulatory Visit

## 2012-10-11 IMAGING — CR DG CHEST 2V
3 series · 3 of 3 positions shown · non-contrast
Comparison: 06/22/2006

CLINICAL DATA: Worsening productive cough, hemoptysis

CHEST - 2 VIEW

[view not recorded (1 of 3)]
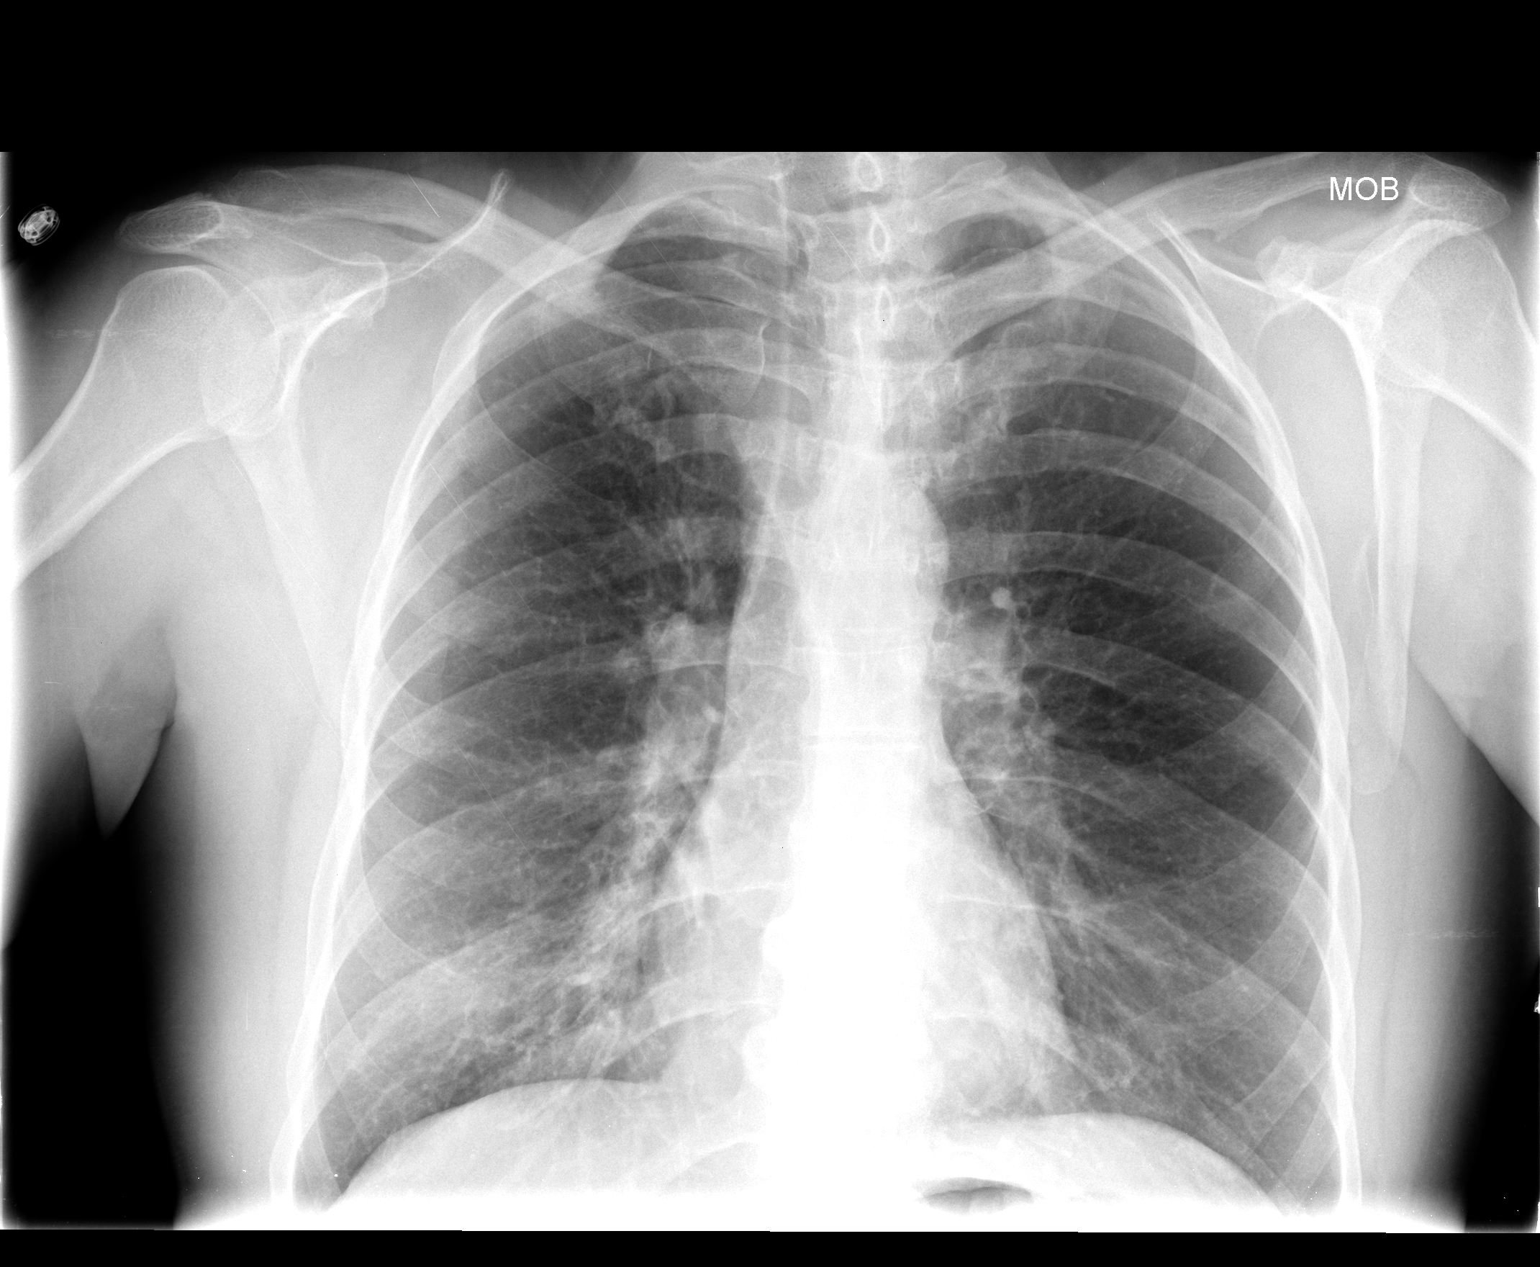

[view not recorded (2 of 3)]
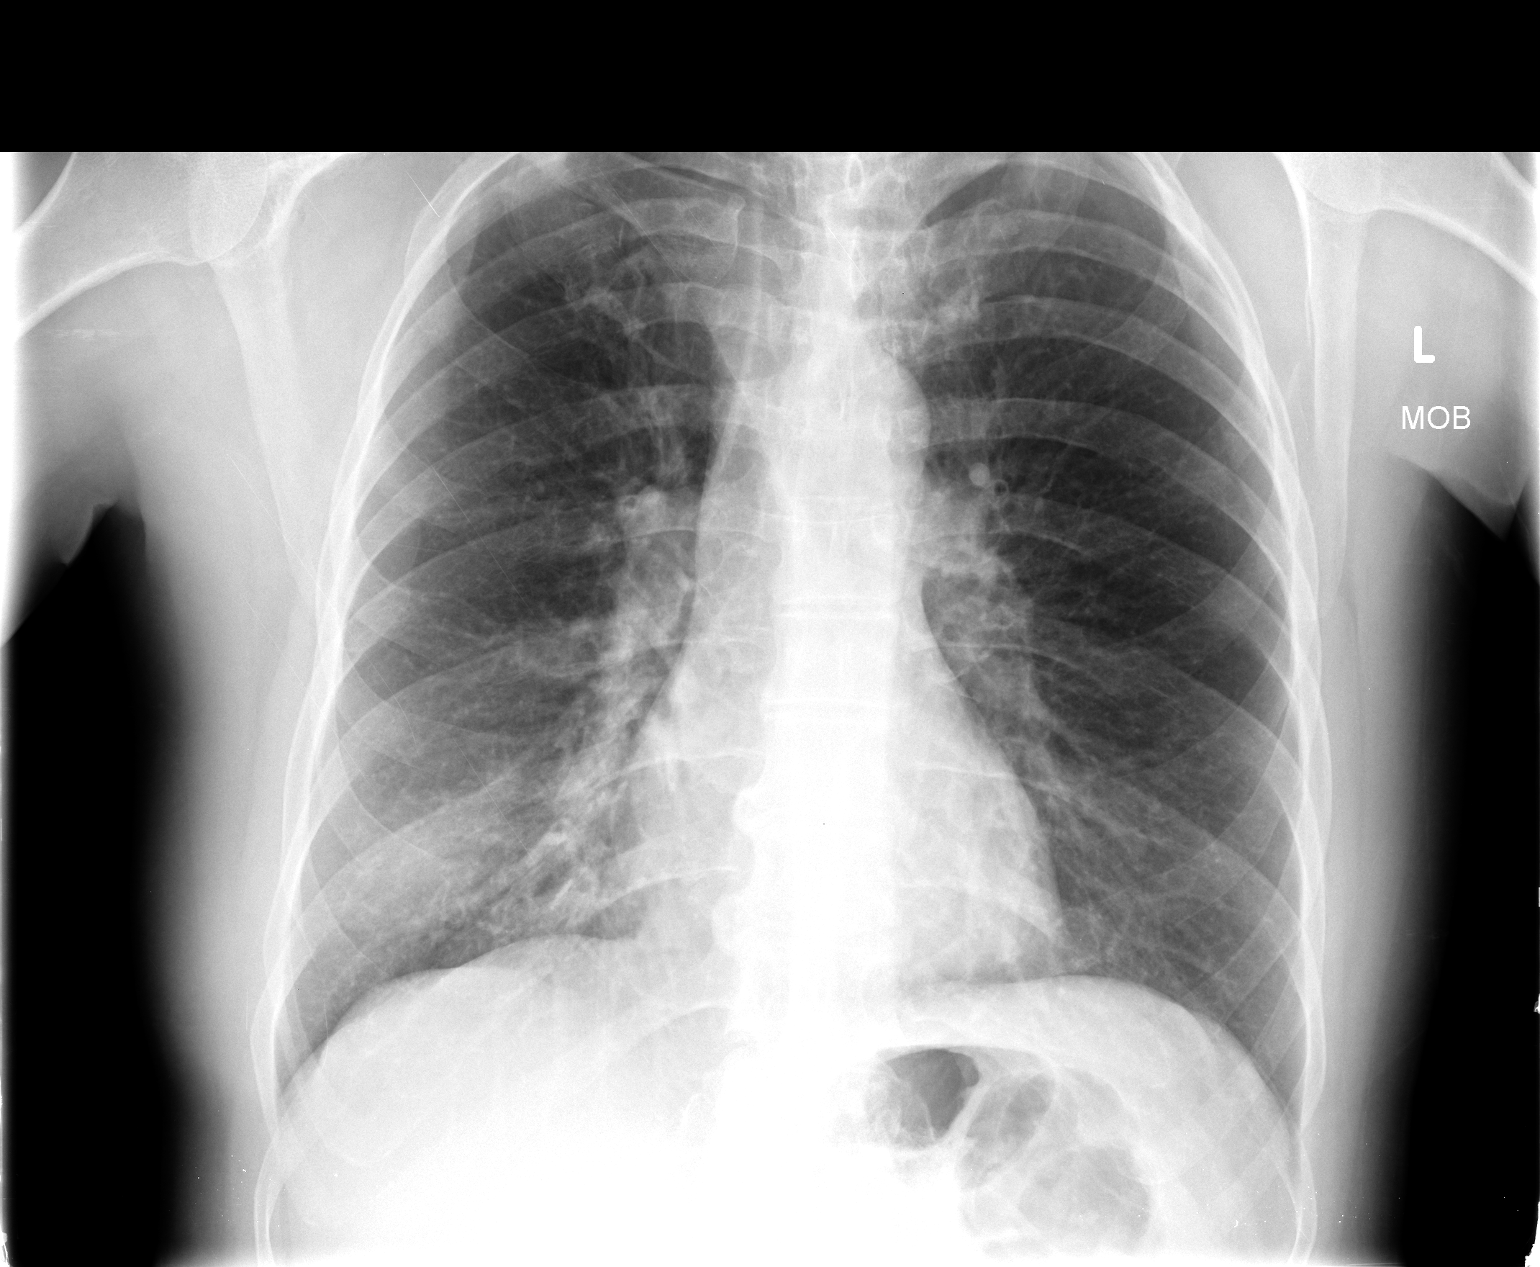

[view not recorded (3 of 3)]
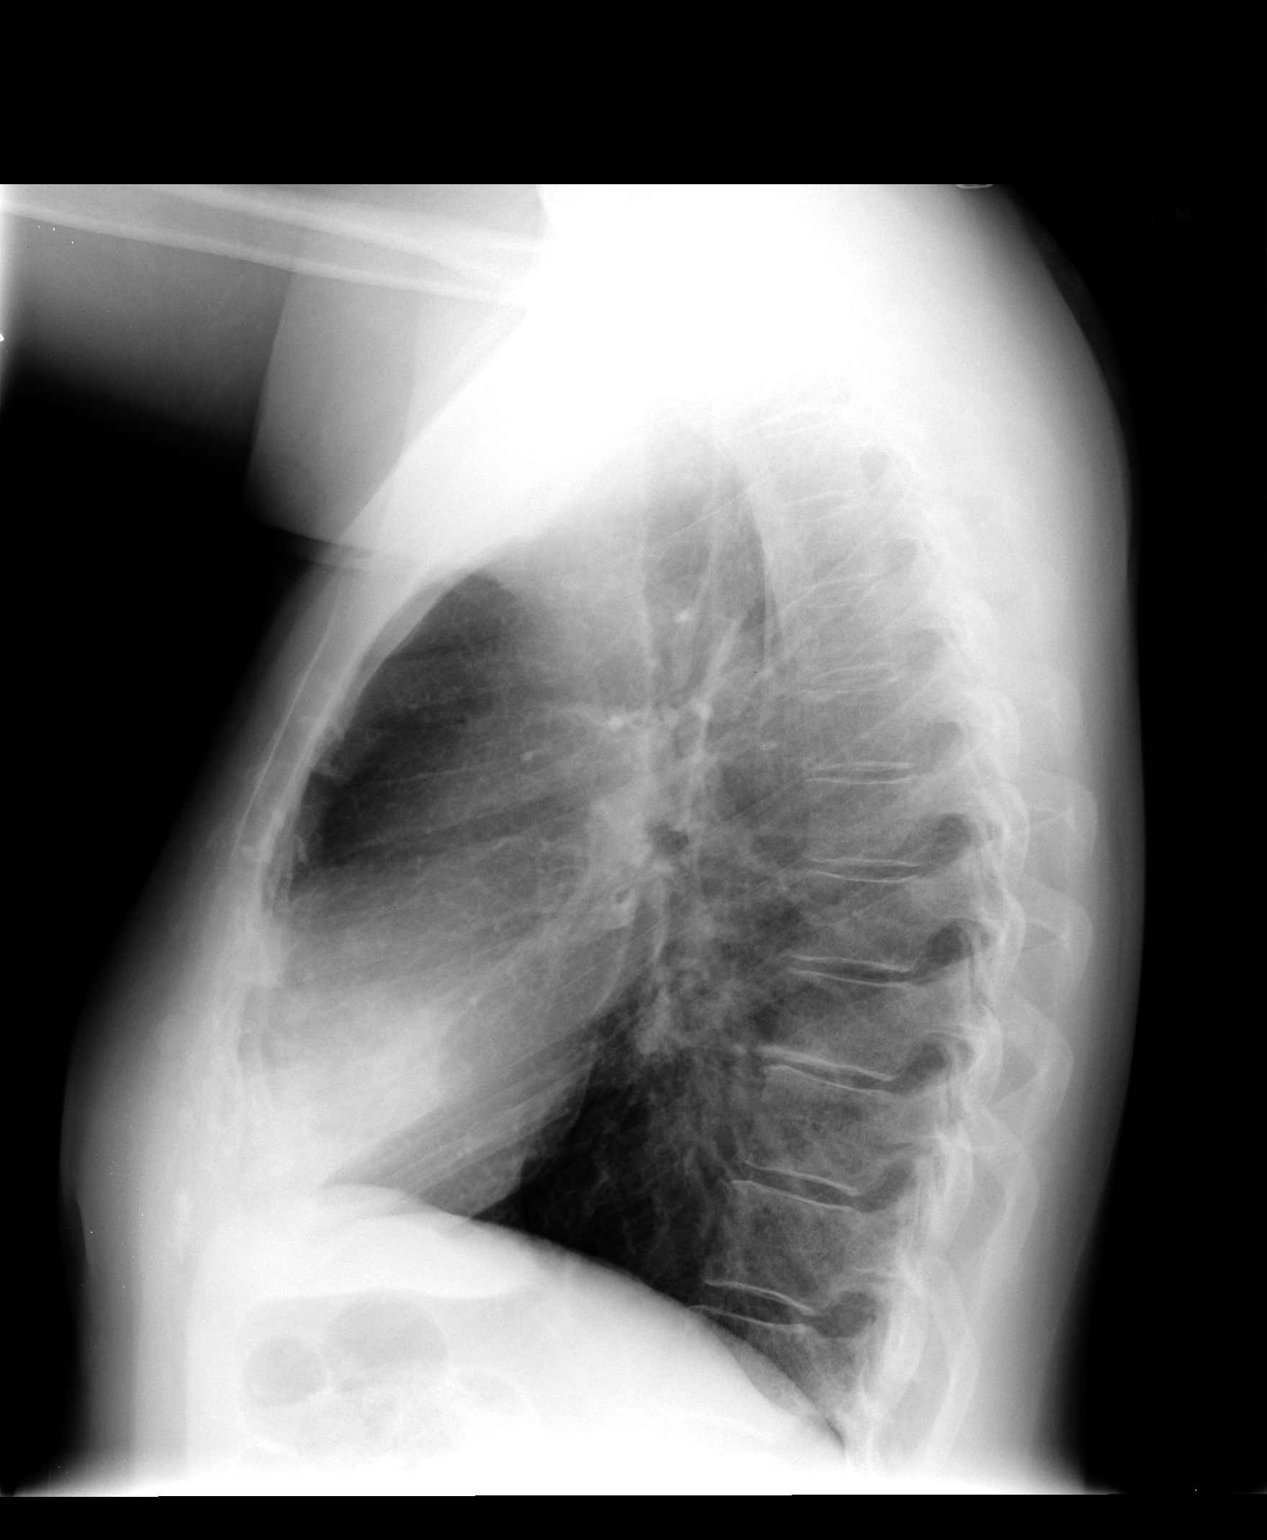

[3 of 3 positions shown; findings below may reference images not displayed]

FINDINGS: Normal heart size, mediastinal contours, and pulmonary vascularity.
Emphysematous bronchitic changes.
Right middle lobe consolidation consistent with pneumonia.
No pleural effusion or pneumothorax.
Bones appear mildly demineralized.
IMPRESSION: Emphysematous and bronchitic changes compatible with COPD.
Right middle lobe pneumonia.

## 2012-10-16 ENCOUNTER — Other Ambulatory Visit: Payer: Self-pay | Admitting: Family Medicine

## 2012-10-16 DIAGNOSIS — M542 Cervicalgia: Secondary | ICD-10-CM

## 2012-10-16 DIAGNOSIS — M545 Low back pain: Secondary | ICD-10-CM | POA: Diagnosis not present

## 2012-10-20 ENCOUNTER — Ambulatory Visit (HOSPITAL_COMMUNITY): Payer: Medicare Other

## 2012-12-12 ENCOUNTER — Telehealth: Payer: Self-pay | Admitting: Family Medicine

## 2012-12-12 NOTE — Telephone Encounter (Signed)
Pt needs rx for oxycodone 15mg 

## 2012-12-12 NOTE — Telephone Encounter (Signed)
Nurse: 3:10PM Pharm: [facility]  Message: 1) Per [employer] they sent over RX via fax - Prescription for Oxycodone 15 mg. Because they were unaware of the provider being associated with [employer]. ** They were going to attempt to send in a request via [employer] today * At this time there is nothing showing. ** Patient would like a call when the script is available for pick-up  Message: 2) Ms. Beitzel wanted Dr. Lorin Picket to be made aware that she is applying for Medicaid. She is awaiting approval before she can move forward on her CT/Bloodwork/Ultrasound because of her finances.

## 2012-12-12 NOTE — Telephone Encounter (Signed)
We can wait for script from [facility]/ last seen 08/31/12 for shoulder pain

## 2012-12-12 NOTE — Telephone Encounter (Signed)
Sorry what is needed??

## 2012-12-13 ENCOUNTER — Other Ambulatory Visit: Payer: Self-pay | Admitting: Family Medicine

## 2012-12-13 NOTE — Telephone Encounter (Signed)
Please write Oxycodone 15 mg IR one q 4 hours prn breakthrough pain not for frequent use #40

## 2012-12-14 NOTE — Telephone Encounter (Signed)
Rxs sent to pharmacy per doctors orders.

## 2012-12-14 NOTE — Telephone Encounter (Signed)
Ok times 4 each

## 2013-01-10 ENCOUNTER — Other Ambulatory Visit: Payer: Self-pay | Admitting: Family Medicine

## 2013-01-10 ENCOUNTER — Encounter: Payer: Self-pay | Admitting: Family Medicine

## 2013-01-10 ENCOUNTER — Ambulatory Visit (INDEPENDENT_AMBULATORY_CARE_PROVIDER_SITE_OTHER): Payer: Medicare Other | Admitting: Family Medicine

## 2013-01-10 VITALS — BP 112/80 | HR 80 | Ht 69.5 in | Wt 173.2 lb

## 2013-01-10 DIAGNOSIS — E785 Hyperlipidemia, unspecified: Secondary | ICD-10-CM

## 2013-01-10 DIAGNOSIS — M549 Dorsalgia, unspecified: Secondary | ICD-10-CM | POA: Diagnosis not present

## 2013-01-10 DIAGNOSIS — G8929 Other chronic pain: Secondary | ICD-10-CM

## 2013-01-10 DIAGNOSIS — Z79899 Other long term (current) drug therapy: Secondary | ICD-10-CM

## 2013-01-10 DIAGNOSIS — D649 Anemia, unspecified: Secondary | ICD-10-CM

## 2013-01-10 DIAGNOSIS — D510 Vitamin B12 deficiency anemia due to intrinsic factor deficiency: Secondary | ICD-10-CM

## 2013-01-10 DIAGNOSIS — D539 Nutritional anemia, unspecified: Secondary | ICD-10-CM

## 2013-01-10 LAB — LIPID PANEL: LDL Cholesterol: 120 mg/dL — ABNORMAL HIGH (ref 0–99)

## 2013-01-10 LAB — BASIC METABOLIC PANEL
BUN: 12 mg/dL (ref 6–23)
CO2: 28 mEq/L (ref 19–32)
Chloride: 103 mEq/L (ref 96–112)
Creat: 0.66 mg/dL (ref 0.50–1.10)
Glucose, Bld: 83 mg/dL (ref 70–99)

## 2013-01-10 MED ORDER — OXYCODONE HCL 15 MG PO TABS: 15.0000 mg | ORAL_TABLET | ORAL | Status: DC | PRN

## 2013-01-10 MED ORDER — OXYCODONE HCL 80 MG PO TB12: 160.0000 mg | ORAL_TABLET | Freq: Three times a day (TID) | ORAL | Status: DC

## 2013-01-10 NOTE — Progress Notes (Signed)
  Subjective:    Patient ID: [name] [name], female    DOB: 05/16/1961, 52 y.o.   MRN: [id number]  HPI This patient has been followed here since 1998. Over time she has developed significant low back pain and had to have surgery. She has had chronic pain and discomfort. First was on multiple mild pain medications gradually increased to Percocets multiple per day then 2 MS Contin then was increased and changed to OxyContin. Over the past several years patient has had her pain medicine gradually increased because of tolerance in non-relief of total pain for her. She has had progressive pain and discomfort in her lower back radiates down the legs also pain in her neck and severe pain radiating down the left arm. She is going to be have an MRI in the near future. Her pain medicine is not well controlling her pain. She denies abusing the medicine she states it does not cause drowsiness. She states it does help with her function. Patient over the past several years has gradually increase to OxyContin 80 mg and is currently takingOxyContin 80 mg 2 tablets 3 times daily. She also uses oxycodone 15 mg immediate release for breakthrough pain averaging anywhere from 2-4 per day.  She has been on this dose over the past year and a half and for the most part is been very stable with it. There is been no suspicious behaviors on the part of the patient.  Patient also has a history of hyperlipidemia and pernicious anemia she tries the healthy she also relates that she does her B12 shots as directed. She would like to have some lab work. She is unable to exercise much because of her back pain.   Family history noncontributory   Review of Systemssee above. Patient denies any constipation. Denies misusing medication.     Objective:   Physical Exam Vital signs stable. Neck no masses lungs are clear heart is regular low back and upper back moderate tenderness as decreased range of motion       Assessment & Plan:  #1  chronic low back pain-this patient is on a significant amount of pain medication.. The patient deserves to have a pain management specialist follow her chronic pain. I talked with her at length about this I believe that is the right thing for her to do, she does consent to having that.I do not feel the patient is misusing the medication. I feel that she is just trying to bring her pain down to a level that is functional for her. At the same time she understands how increasing pain in the face of already being on significant pain medication requires the expertise of a pain management clinic.  #2-pernicious anemia check B12 level.  #3 hyperlipidemia check lab work watch diet closely

## 2013-01-11 ENCOUNTER — Encounter: Payer: Self-pay | Admitting: Family Medicine

## 2013-01-11 LAB — CBC WITH DIFFERENTIAL/PLATELET
Basophils Absolute: 0.1 10*3/uL (ref 0.0–0.1)
Basophils Relative: 1 % (ref 0–1)
Eosinophils Absolute: 0.2 10*3/uL (ref 0.0–0.7)
Eosinophils Relative: 2 % (ref 0–5)
Lymphocytes Relative: 30 % (ref 12–46)
MCH: 32.5 pg (ref 26.0–34.0)
MCV: 94.5 fL (ref 78.0–100.0)
Platelets: 187 10*3/uL (ref 150–400)
RDW: 12.8 % (ref 11.5–15.5)
WBC: 10.4 10*3/uL (ref 4.0–10.5)

## 2013-01-12 ENCOUNTER — Encounter: Payer: Self-pay | Admitting: *Deleted

## 2013-01-14 ENCOUNTER — Other Ambulatory Visit: Payer: Self-pay | Admitting: Family Medicine

## 2013-01-15 NOTE — Telephone Encounter (Signed)
Ok refill this with 4 additional refills

## 2013-01-15 NOTE — Telephone Encounter (Signed)
Med called into pharm with 4 additional refills.

## 2013-01-24 ENCOUNTER — Telehealth: Payer: Self-pay | Admitting: Family Medicine

## 2013-01-24 NOTE — Telephone Encounter (Signed)
Patient was referred to [facility], but they do not take [place]. Please advise.

## 2013-01-25 NOTE — Telephone Encounter (Signed)
Brendale, please see. Patient if she wants a pain med doc she may need to go to [place] , etc. Long-term we will not be managing her pain medicines. The last time she was here she was given prescriptions that would cover her for 3 months. You may need to ask the patient if she has any referral sources or I would recommend searching for pain medicine clinics in [place]. Ultimately this patient may have to go to pain medicine specialist in [place] or a surrounding cities such as [facility] [place] or [employer]. Please explain to the patient the difficulties.

## 2013-01-25 NOTE — Telephone Encounter (Signed)
Spoke with [facility] @ Dr. Ronal Fear office, they do not see out of state patients, was given name & # for pain management in [place], will call and refer

## 2013-01-25 NOTE — Telephone Encounter (Signed)
Called Dr. Craig Guess in [place], office is closed, will re-open 01/29/13, will call again then to refer. Ph# [phone], Fx# [phone]

## 2013-01-29 NOTE — Telephone Encounter (Signed)
Spoke with Dr. Earl Lites office, faxed needed info, he will review and if he agrees to take her as a new patient they will call her to set up, if he decides not to take her as a patient, they will let me know so we can try to refer somewhere else

## 2013-02-14 NOTE — Telephone Encounter (Signed)
Received fax from Dr. Craig Guess, they are unable to accept patient, will work on referral elsewhere

## 2013-03-30 DIAGNOSIS — F17200 Nicotine dependence, unspecified, uncomplicated: Secondary | ICD-10-CM | POA: Diagnosis not present

## 2013-03-30 DIAGNOSIS — R112 Nausea with vomiting, unspecified: Secondary | ICD-10-CM | POA: Diagnosis not present

## 2013-03-30 DIAGNOSIS — R1110 Vomiting, unspecified: Secondary | ICD-10-CM | POA: Diagnosis not present

## 2013-04-05 ENCOUNTER — Ambulatory Visit (INDEPENDENT_AMBULATORY_CARE_PROVIDER_SITE_OTHER): Payer: Medicare Other | Admitting: Family Medicine

## 2013-04-05 ENCOUNTER — Encounter: Payer: Self-pay | Admitting: Family Medicine

## 2013-04-05 ENCOUNTER — Other Ambulatory Visit (INDEPENDENT_AMBULATORY_CARE_PROVIDER_SITE_OTHER): Payer: Self-pay | Admitting: *Deleted

## 2013-04-05 VITALS — BP 130/82 | Wt 179.2 lb

## 2013-04-05 DIAGNOSIS — M549 Dorsalgia, unspecified: Secondary | ICD-10-CM | POA: Diagnosis not present

## 2013-04-05 DIAGNOSIS — K920 Hematemesis: Secondary | ICD-10-CM

## 2013-04-05 DIAGNOSIS — G8929 Other chronic pain: Secondary | ICD-10-CM | POA: Diagnosis not present

## 2013-04-05 DIAGNOSIS — K2971 Gastritis, unspecified, with bleeding: Secondary | ICD-10-CM | POA: Diagnosis not present

## 2013-04-05 DIAGNOSIS — K2991 Gastroduodenitis, unspecified, with bleeding: Secondary | ICD-10-CM

## 2013-04-05 MED ORDER — ONDANSETRON HCL 8 MG PO TABS: 8.0000 mg | ORAL_TABLET | Freq: Three times a day (TID) | ORAL | Status: DC | PRN

## 2013-04-05 MED ORDER — PANTOPRAZOLE SODIUM 40 MG PO TBEC: 40.0000 mg | DELAYED_RELEASE_TABLET | Freq: Every day | ORAL | Status: DC

## 2013-04-05 MED ORDER — OXYCODONE HCL 80 MG PO TB12: 160.0000 mg | ORAL_TABLET | Freq: Three times a day (TID) | ORAL | Status: DC

## 2013-04-05 MED ORDER — HYDROXYZINE HCL 25 MG PO TABS: 25.0000 mg | ORAL_TABLET | Freq: Three times a day (TID) | ORAL | Status: DC | PRN

## 2013-04-05 MED ORDER — OXYCODONE HCL 15 MG PO TABS: 15.0000 mg | ORAL_TABLET | ORAL | Status: DC | PRN

## 2013-04-05 NOTE — Progress Notes (Signed)
  Subjective:    Patient ID: [name] [name], female    DOB: 12-Mar-1961, 52 y.o.   MRN: [id number]  Emesis  This is a chronic problem. The current episode started more than 1 year ago. The problem occurs intermittently.   patient has had frequent nausea more so in the morning time present over the past 2 and half months or recently is having some intermittent spells where she is throwing up. She had dark brown emesis yesterday followed by bright red blood emesis later that day she denies any rectal bleeding denies dizziness denies sweats chills nausea vomiting shortness of breath. She's never had this problem before she's been using high doses of ibuprofen because of back pain she is also on large doses of OxyContin and oxycodone for chronic back pain she will be seeing a pain medicine specialist for the end of August or started September. PMH benign chronic back pain no history of ulcers. Family history noncontributory  Patient does not smoke currently.  Review of Systems  Gastrointestinal: Positive for vomiting.   please see above.     Objective:   Physical Exam Her color is good lungs are clear no crackles heart is regular pulses normal vital signs stable abdomen soft minimal epigastric tenderness extremities no edema skin warm dry neurologic grossly normal subjective chronic back pain.       Assessment & Plan:  Chronic back pain-prescriptions for her pain medications right now to cover her into early September. She is to followup with pain medicine specialist they will be prescribing her medicines ongoing  Probable gastritis with concern for either localized bleeding versus ulcer. Start Protonix 40 mg 1 every day. Avoid all anti-inflammatories. Try to quit smoking. In addition to this use Tylenol only as an additional medicine to her oxycodone. Plus I spoke with Dr. Patty Sermons office. They're PA will be seeing her in the near future. They will be doing a EGD. Further workup per GI.  Patient  was told all the warning signs to watch for regarding GI hemorrhage in the need to go to emergency department if any problems. 25 minutes spent with patient.

## 2013-04-06 ENCOUNTER — Encounter (HOSPITAL_COMMUNITY)
Admission: RE | Disposition: A | Discharge status: Home / Self Care | Payer: Self-pay | Source: Ambulatory Visit | Attending: Internal Medicine

## 2013-04-06 ENCOUNTER — Ambulatory Visit (HOSPITAL_COMMUNITY)
Admission: RE | Admit: 2013-04-06 | Discharge: 2013-04-06 | Disposition: A | Discharge status: Home / Self Care | Payer: Medicare Other | Source: Ambulatory Visit | Attending: Internal Medicine | Admitting: Internal Medicine

## 2013-04-06 ENCOUNTER — Encounter (HOSPITAL_COMMUNITY): Payer: Self-pay | Admitting: *Deleted

## 2013-04-06 DIAGNOSIS — K2100 Gastro-esophageal reflux disease with esophagitis, without bleeding: Secondary | ICD-10-CM | POA: Insufficient documentation

## 2013-04-06 DIAGNOSIS — K922 Gastrointestinal hemorrhage, unspecified: Secondary | ICD-10-CM

## 2013-04-06 DIAGNOSIS — R1110 Vomiting, unspecified: Secondary | ICD-10-CM | POA: Insufficient documentation

## 2013-04-06 DIAGNOSIS — K920 Hematemesis: Secondary | ICD-10-CM

## 2013-04-06 DIAGNOSIS — K208 Other esophagitis: Secondary | ICD-10-CM

## 2013-04-06 DIAGNOSIS — K2960 Other gastritis without bleeding: Secondary | ICD-10-CM | POA: Insufficient documentation

## 2013-04-06 DIAGNOSIS — K228 Other specified diseases of esophagus: Secondary | ICD-10-CM

## 2013-04-06 HISTORY — DX: Fibromyalgia: M79.7

## 2013-04-06 HISTORY — PX: ESOPHAGOGASTRODUODENOSCOPY: SHX5428

## 2013-04-06 HISTORY — DX: Unspecified osteoarthritis, unspecified site: M19.90

## 2013-04-06 SURGERY — EGD (ESOPHAGOGASTRODUODENOSCOPY)
Anesthesia: Moderate Sedation

## 2013-04-06 MED ORDER — MIDAZOLAM HCL 5 MG/5ML IJ SOLN
INTRAMUSCULAR | Status: DC | PRN
  Administered 2013-04-06 (×2): 2 mg via INTRAVENOUS

## 2013-04-06 MED ORDER — MEPERIDINE HCL 25 MG/ML IJ SOLN
INTRAMUSCULAR | Status: DC | PRN
  Administered 2013-04-06 (×2): 25 mg via INTRAVENOUS

## 2013-04-06 MED ORDER — BUTAMBEN-TETRACAINE-BENZOCAINE 2-2-14 % EX AERO
INHALATION_SPRAY | CUTANEOUS | Status: DC | PRN
  Administered 2013-04-06: 2 via TOPICAL

## 2013-04-06 MED ORDER — SODIUM CHLORIDE 0.9 % IJ SOLN
INTRAMUSCULAR | Status: AC
  Filled 2013-04-06: qty 10

## 2013-04-06 MED ORDER — PROMETHAZINE HCL 25 MG/ML IJ SOLN
INTRAMUSCULAR | Status: AC
  Filled 2013-04-06: qty 1

## 2013-04-06 MED ORDER — PROMETHAZINE HCL 25 MG/ML IJ SOLN
INTRAMUSCULAR | Status: DC | PRN
  Administered 2013-04-06: 12.5 mg via INTRAVENOUS

## 2013-04-06 MED ORDER — SODIUM CHLORIDE 0.9 % IV SOLN
INTRAVENOUS | Status: DC
Start: 2013-04-06 — End: 2013-04-06
  Administered 2013-04-06: 1000 mL via INTRAVENOUS

## 2013-04-06 MED ORDER — MEPERIDINE HCL 50 MG/ML IJ SOLN
INTRAMUSCULAR | Status: AC
  Filled 2013-04-06: qty 1

## 2013-04-06 MED ORDER — STERILE WATER FOR IRRIGATION IR SOLN
Status: DC | PRN
  Administered 2013-04-06: 12:00:00

## 2013-04-06 MED ORDER — MIDAZOLAM HCL 5 MG/5ML IJ SOLN
INTRAMUSCULAR | Status: AC
  Filled 2013-04-06: qty 15

## 2013-04-06 NOTE — Op Note (Addendum)
EGD PROCEDURE REPORT  PATIENT:  [name] [name]  MR#:  [id number] Birthdate:  Oct 10, 1960, 52 y.o., female Endoscopist:  Dr. Malissa Hippo, MD Referred By:  Dr. Lilyan Punt, MD Procedure Date: 04/06/2013  Procedure:   EGD  Indications:  Patient is a 52 year old Caucasian female who presents with 2 months history of intermittent vomiting and an episode of hematemesis last week. Patient has been on ibuprofen chronically. It was stopped by Dr. Gerda Diss.            Informed Consent:  The risks, benefits, alternatives & imponderables which include, but are not limited to, bleeding, infection, perforation, drug reaction and potential missed lesion have been reviewed.  The potential for biopsy, lesion removal, esophageal dilation, etc. have also been discussed.  Questions have been answered.  All parties agreeable.  Please see history & physical in medical record for more information.  Medications:  Demerol 50 mg IV Versed 5 mg IV Promethazine 12.5 mg IV in dilated form. Cetacaine spray topically for oropharyngeal anesthesia  Description of procedure:  The endoscope was introduced through the mouth and advanced to the second portion of the duodenum without difficulty or limitations. The mucosal surfaces were surveyed very carefully during advancement of the scope and upon withdrawal.  Findings:  Esophagus:  Because of proximal and middle segment was normal. Distally there was 1 cm long linear erosion extending to GE junction. 2 small erosions noted at GE junction as well. GEJ:  41 cm Stomach:  Stomach is distended and with insufflation 30 and falls in the proximal stomach was normal. Examination glucose and body was normal. Few antral erosions noted. Mucosa at pyloric channel was erythematous and he denies no erosions or ulcers noted. It was patent though.] Fundus and cardia were examined retroflex the scope and were normal. Duodenum:  Normal bulbar and post bulbar mucosa.  Therapeutic/Diagnostic  Maneuvers Performed:  None  Complications:  None.  Impression: Erosive reflux esophagitis. Erosive gastritis along with inflammation to mucosa of pyloric channel without stricture. Suspect she may have bled from [facility] which has healed by now. She may have underlying gastroparesis secondary to narcotic therapy.  Recommendations:  Anti-reflux measures. Pantoprazole 40 mg by mouth q. A.m. H. pylori serology.   Na Waldrip U  04/06/2013  12:02 PM  CC: Dr. Lilyan Punt, MD & Dr. Bonnetta Barry ref. provider found

## 2013-04-06 NOTE — H&P (Signed)
[name] [name] is an 53 y.o. female.   Chief Complaint: Patient is here for EGD. HPI: Patient is a 52 year old Caucasian female who presents with 2 month history of monitoring in last week she had an episode of hematemesis. She denies melena on rectal bleeding. She complains of intermittent discomfort in the right upper quadrant. She denies frequent heartburn or dysphagia she has chronic back pain and takes ibuprofen 800 mg 3 times a day.  Past Medical History  Diagnosis Date  . Cancer   . Poland's syndrome   . Eczema of hand   . Irritable bowel syndrome (IBS)   . Asthma   . Vitamin B 12 deficiency   . Hyperglycemia   . Chronic back pain   . Cervical cancer   . Fibromyalgia   . Arthritis     Past Surgical History  Procedure Laterality Date  . Breast surgery    . Back surgery  2000;2001    Family History  Problem Relation Age of Onset  . Heart attack Father   . Diabetes Father   . Hypertension Father    Social History:  reports that she has been smoking.  She does not have any smokeless tobacco history on file. She reports that she does not drink alcohol or use illicit drugs.  Allergies:  Allergies  Allergen Reactions  . Amoxicillin-Pot Clavulanate   . Latex   . Vioxx (Rofecoxib)     Medications Prior to Admission  Medication Sig Dispense Refill  . ALPRAZolam (XANAX) 1 MG tablet TAKE 1/2 TABLET BY MOUTH DURING THE DAY AS NEEDED AND 1 TABLET BY MOUTH EVERY NIGHT AT BEDTIME  45 tablet  0  . Cyanocobalamin (VITAMIN B-12 IJ) Inject as directed.        Marland Kitchen oxyCODONE (OXYCONTIN) 80 MG 12 hr tablet Take 2 tablets (160 mg total) by mouth 3 (three) times daily.  180 tablet  0  . oxyCODONE (ROXICODONE) 15 MG immediate release tablet Take 1 tablet (15 mg total) by mouth every 4 (four) hours as needed.  75 tablet  0  . cyclobenzaprine (FLEXERIL) 10 MG tablet TAKE 1 TABLET BY MOUTH EVERY 8 HOURS AS NEEDED . USE SPARINGLY  45 tablet  0  . fluticasone (FLONASE) 50 MCG/ACT nasal spray  Place 2 sprays into the nose daily.      . hydrOXYzine (ATARAX/VISTARIL) 25 MG tablet Take 1 tablet (25 mg total) by mouth 3 (three) times daily as needed.  45 tablet  4  . lubiprostone (AMITIZA) 8 MCG capsule Take 8 mcg by mouth 2 (two) times daily with a meal.        . ondansetron (ZOFRAN) 8 MG tablet Take 1 tablet (8 mg total) by mouth every 8 (eight) hours as needed for nausea.  30 tablet  2  . pantoprazole (PROTONIX) 40 MG tablet Take 1 tablet (40 mg total) by mouth daily.  30 tablet  1    No results found for this or any previous visit (from the past 48 hour(s)). No results found.  ROS  Blood pressure 118/64, pulse 62, temperature 97.9 F (36.6 C), temperature source Oral, resp. rate 18, last menstrual period 03/19/2013, SpO2 95.00%. Physical Exam  Constitutional: She appears well-developed and well-nourished.  HENT:  Mouth/Throat: Oropharynx is clear and moist.  Eyes: Conjunctivae are normal. No scleral icterus.  Neck: No thyromegaly present.  Cardiovascular: Normal rate, regular rhythm and normal heart sounds.   No murmur heard. Respiratory: Effort normal and breath sounds normal.  GI: Soft. She exhibits no distension and no mass. There is no rebound.  Mild tenderness below the right costal margin  Lymphadenopathy:    She has no cervical adenopathy.  Neurological: She is alert.  Skin: Skin is warm and dry.     Assessment/Plan Upper GI bleed. History of NSAID use. Diagnostic EGD.  Tavon Magnussen U 04/06/2013, 11:40 AM

## 2013-04-09 ENCOUNTER — Encounter (HOSPITAL_COMMUNITY): Payer: Self-pay | Admitting: Internal Medicine

## 2013-04-18 NOTE — Progress Notes (Signed)
Apt has been scheduled for 05/07/13 with Dr. Rehman.  

## 2013-04-20 ENCOUNTER — Telehealth: Payer: Self-pay | Admitting: Family Medicine

## 2013-04-20 MED ORDER — NEOMYCIN-POLYMYXIN-HC 3.5-10000-1 OT SOLN: OTIC | Status: DC

## 2013-04-20 NOTE — Telephone Encounter (Signed)
Cortisporin otic, 4 to 6 drops qid both ears, 3 refills, follow up if worse, also if high fevers then NTBS

## 2013-04-20 NOTE — Telephone Encounter (Signed)
Having inner ear pain in both ears x 3days, low grade fever, hurts to touch ears, no drainage but feels wet inside.

## 2013-04-20 NOTE — Telephone Encounter (Signed)
Patient says she is having inner ear pain and due to her having a death in her family and living so far away she is unable to come in. She would like something called in to [facility].

## 2013-04-20 NOTE — Telephone Encounter (Signed)
Notified patient Cortisporin otic, 4 to 6 drops qid both ears, 3 refills, follow up if worse, also if high fevers then NTBS. RX sent to pharmacy. Patient verbalized understanding.

## 2013-05-03 ENCOUNTER — Telehealth: Payer: Self-pay | Admitting: Family Medicine

## 2013-05-03 NOTE — Telephone Encounter (Signed)
Patient would like her oxycodone mailed to her 15 mg 90 tab

## 2013-05-03 NOTE — Telephone Encounter (Signed)
Patient states she will see pain management Sept 24 but will need [place] to prescribe it for next few mths until they take it over.

## 2013-05-04 NOTE — Telephone Encounter (Signed)
Oxycodone 15 mg, #90, one every 8 hours as needed for breakthrough pain.

## 2013-05-04 NOTE — Telephone Encounter (Signed)
Patient notified RX ready for pickup. Patient requested to have script mailed to her home address. Sent to front desk to be mailed.

## 2013-05-07 ENCOUNTER — Ambulatory Visit (INDEPENDENT_AMBULATORY_CARE_PROVIDER_SITE_OTHER): Payer: Medicare Other | Admitting: Internal Medicine

## 2013-05-12 ENCOUNTER — Other Ambulatory Visit: Payer: Self-pay | Admitting: Family Medicine

## 2013-05-25 ENCOUNTER — Telehealth: Payer: Self-pay | Admitting: Family Medicine

## 2013-05-25 ENCOUNTER — Other Ambulatory Visit: Payer: Self-pay

## 2013-05-25 MED ORDER — OXYCODONE HCL 80 MG PO TB12: 160.0000 mg | ORAL_TABLET | Freq: Three times a day (TID) | ORAL | Status: DC

## 2013-05-25 MED ORDER — OXYCODONE HCL 15 MG PO TABS: 15.0000 mg | ORAL_TABLET | ORAL | Status: DC | PRN

## 2013-05-25 NOTE — Telephone Encounter (Signed)
Notified patient scripts are ready and will be mailed to her.

## 2013-05-25 NOTE — Telephone Encounter (Signed)
Patient would like to have the following prescriptions mailed to her home:  oxyCODONE (OXYCONTIN) 80 MG 12 hr tablet wants 180 tablets  oxyCODONE (ROXICODONE) 15 MG immediate release tablet wants 90 tablets  Patient states she is going to Pain Management at the end of this Month.  Patient was sent prescriptions on 05/04/2013 from our office.

## 2013-05-25 NOTE — Telephone Encounter (Signed)
May give refills and mail

## 2013-06-13 ENCOUNTER — Other Ambulatory Visit: Payer: Self-pay | Admitting: Family Medicine

## 2013-06-13 NOTE — Telephone Encounter (Signed)
May refill medication x4

## 2013-06-19 ENCOUNTER — Encounter: Payer: Self-pay | Admitting: Family Medicine

## 2013-06-19 ENCOUNTER — Telehealth: Payer: Self-pay | Admitting: *Deleted

## 2013-06-19 ENCOUNTER — Ambulatory Visit (INDEPENDENT_AMBULATORY_CARE_PROVIDER_SITE_OTHER): Payer: Medicare Other | Admitting: Family Medicine

## 2013-06-19 VITALS — BP 112/80 | Ht 69.5 in | Wt 176.2 lb

## 2013-06-19 DIAGNOSIS — G8929 Other chronic pain: Secondary | ICD-10-CM

## 2013-06-19 DIAGNOSIS — R300 Dysuria: Secondary | ICD-10-CM

## 2013-06-19 DIAGNOSIS — M549 Dorsalgia, unspecified: Secondary | ICD-10-CM

## 2013-06-19 DIAGNOSIS — Z23 Encounter for immunization: Secondary | ICD-10-CM

## 2013-06-19 LAB — POCT URINALYSIS DIPSTICK: Spec Grav, UA: 1.02

## 2013-06-19 MED ORDER — SULFAMETHOXAZOLE-TMP DS 800-160 MG PO TABS: 1.0000 | ORAL_TABLET | Freq: Two times a day (BID) | ORAL | Status: DC

## 2013-06-19 NOTE — Telephone Encounter (Signed)
Vit b12 vial 1cc im monthly with one year refill. diag pernicious anemia 281.0 called into [facility] [phone] per Dr. Lorin Picket.

## 2013-06-19 NOTE — Progress Notes (Signed)
  Subjective:    Patient ID: [name] [name], female    DOB: [birth year], 52 y.o.   MRN: [id number]  HPI Patient is here to discuss the future monitoring of her pain medications. She states that the pain management doctor she seen about 2 weeks ago was very rude and unpleasant. She does not want to continue to seen this doctor and wants PCP opinion on this matter.  This patient was seen today for chronic pain  The medication list was reviewed and updated.  Discussion was held with the patient regarding compliance with pain medication. The patient was advised the importance of maintaining medication and not using illegal substances with these. The patient was educated that we can provide 3 monthly scripts for their medication, it is their responsibility to follow the instructions. Discussion was held with the patient to make sure they're not having significant side effects. Patient is aware that pain medications are meant to minimize the severity of the pain to allow their pain levels to improve to allow for better function. They are aware of that pain medications cannot totally remove their pain.   Patient states she is in agreement with getting consultation from a pain management specialist.  Review of Systems She denies fevers chills sweats nausea vomiting diarrhea.    Objective:   Physical Exam Lungs clear heart regular subjective discomfort in her neck with limited range of motion also subjective discomfort in the lumbar spine.       Assessment & Plan:  #1 dysuria/UTI Bactrim DS twice a day for the next 5-7 days #2 chronic pain-she has chronic pain in the neck as well as her lower back. She states the pain medication allows her to function better. With her being on a significant amount of long-acting pain medication I feel that this is beyond our ongoing capabilities to manage. I have advised her that we would are working to set her up with pain management. She is in agreement with this.  She would like for [place] to try to set her up with [facility] pain management given that she has had previous evaluation with the neurology and has had a couple of back surgeries with [facility].

## 2013-06-28 ENCOUNTER — Telehealth: Payer: Self-pay | Admitting: Family Medicine

## 2013-06-28 ENCOUNTER — Other Ambulatory Visit: Payer: Self-pay | Admitting: Family Medicine

## 2013-06-28 MED ORDER — OXYCODONE HCL 15 MG PO TABS: 15.0000 mg | ORAL_TABLET | ORAL | Status: DC | PRN

## 2013-06-28 MED ORDER — OXYCODONE HCL ER 80 MG PO T12A: EXTENDED_RELEASE_TABLET | ORAL | Status: DC

## 2013-06-28 NOTE — Telephone Encounter (Signed)
Last office visit 06/19/13

## 2013-06-28 NOTE — Telephone Encounter (Signed)
Patient would like OxyContin 80 mg and oxycodone 15 mg mailed to her.

## 2013-06-28 NOTE — Telephone Encounter (Signed)
Scripts were written. Both are 30 day supplies. These can be mailed.

## 2013-06-29 NOTE — Telephone Encounter (Signed)
Left message on voicemail that scripts were put in the mail.

## 2013-07-25 ENCOUNTER — Telehealth: Payer: Self-pay | Admitting: Family Medicine

## 2013-07-25 MED ORDER — OXYCODONE HCL ER 80 MG PO T12A: EXTENDED_RELEASE_TABLET | ORAL | Status: DC

## 2013-07-25 MED ORDER — OXYCODONE HCL 15 MG PO TABS: 15.0000 mg | ORAL_TABLET | ORAL | Status: DC | PRN

## 2013-07-25 NOTE — Telephone Encounter (Signed)
One refill each

## 2013-07-25 NOTE — Telephone Encounter (Signed)
Last fill 06/28/13

## 2013-07-25 NOTE — Telephone Encounter (Signed)
Rxs printed and mailed to patient per patient request.

## 2013-07-25 NOTE — Telephone Encounter (Signed)
OxyCODONE (OXYCONTIN) 80 mg T12A 12 hr tablet   oxyCODONE (ROXICODONE) 15 MG immediate release tablet  Refill an mail to patient, advised pt we may not be able to do this That we would let her know if she needed to come pick up this  Refill

## 2013-08-01 ENCOUNTER — Telehealth: Payer: Self-pay | Admitting: Family Medicine

## 2013-08-01 MED ORDER — OXYCODONE HCL ER 80 MG PO T12A: EXTENDED_RELEASE_TABLET | ORAL | Status: DC

## 2013-08-01 MED ORDER — OXYCODONE HCL 15 MG PO TABS: 15.0000 mg | ORAL_TABLET | ORAL | Status: DC | PRN

## 2013-08-01 NOTE — Telephone Encounter (Signed)
Patient says that she never received the Rx for pain medication that was mailed to her last week. She said it is due to be filled today and she needs it.

## 2013-08-01 NOTE — Telephone Encounter (Signed)
It should be noted that all scripts in the future will have to be picked up.t. Each one will be dated 30 days from the prior one. In addition to this we will be sending a letter to the pain management consultation at [facility]. Asking them to help [place] with this patient and assume all management of her pain management.

## 2013-08-01 NOTE — Telephone Encounter (Signed)
She is hoping to have someone call her in the next few minutes so she can know to head this way since [place].

## 2013-08-01 NOTE — Telephone Encounter (Signed)
Notified patient that scripts are ready for pickup. Advised patient that she will have to pick up scripts from now on. Patient verbalized understanding.

## 2013-08-01 NOTE — Telephone Encounter (Signed)
May do scripts , from here on pt to pick up medication scripts one month at a time,

## 2013-08-06 ENCOUNTER — Telehealth: Payer: Self-pay | Admitting: Family Medicine

## 2013-08-06 NOTE — Telephone Encounter (Signed)
FYI - Patient left me a voicemail Friday 08/03/13 afternoon stating that she called [facility] pain [facility] and they told her to call back in 1 to 2 weeks, they are apparently booked out through January, patient also stated in message that she may need to come here for office visit and meds in January, states she will let me know as soon as she has appointment scheduled

## 2013-08-27 ENCOUNTER — Telehealth: Payer: Self-pay | Admitting: Family Medicine

## 2013-08-27 MED ORDER — OXYCODONE HCL ER 80 MG PO T12A: EXTENDED_RELEASE_TABLET | ORAL | Status: DC

## 2013-08-27 MED ORDER — OXYCODONE HCL 15 MG PO TABS: 15.0000 mg | ORAL_TABLET | ORAL | Status: DC | PRN

## 2013-08-27 NOTE — Telephone Encounter (Signed)
Patient needs Rx for oxycontin and oxycodone

## 2013-08-27 NOTE — Telephone Encounter (Signed)
Patient notified

## 2013-08-27 NOTE — Telephone Encounter (Signed)
May have prescriptions for each. These may be filled on the 19th. Patient should go ahead and schedule office visit for January.

## 2013-08-27 NOTE — Telephone Encounter (Addendum)
Last filled 08/01/13 and last seen 06/19/13

## 2013-08-29 NOTE — Telephone Encounter (Signed)
FYI - Patient in office to pick up Rx.  States that her appointment with [facility] pain management is for 10/31/2013.

## 2013-09-17 ENCOUNTER — Telehealth: Payer: Self-pay | Admitting: Family Medicine

## 2013-09-17 NOTE — Telephone Encounter (Signed)
Prior Auth obtained for patient's OXYCODONE Hcl 15mg  every 4 hours as needed for break through pain, expires 09/12/14, auth obtained through [facility]

## 2013-09-17 NOTE — Telephone Encounter (Signed)
Prior Auth obtained for patient's TEMOVATE 0.05% ointment, expires 09/12/2014 through [facility]

## 2013-09-27 ENCOUNTER — Encounter: Payer: Self-pay | Admitting: Family Medicine

## 2013-09-27 ENCOUNTER — Ambulatory Visit (INDEPENDENT_AMBULATORY_CARE_PROVIDER_SITE_OTHER): Payer: Medicare Other | Admitting: Family Medicine

## 2013-09-27 VITALS — BP 112/74 | Ht 69.5 in | Wt 175.0 lb

## 2013-09-27 DIAGNOSIS — R739 Hyperglycemia, unspecified: Secondary | ICD-10-CM | POA: Insufficient documentation

## 2013-09-27 DIAGNOSIS — J45909 Unspecified asthma, uncomplicated: Secondary | ICD-10-CM

## 2013-09-27 DIAGNOSIS — J309 Allergic rhinitis, unspecified: Secondary | ICD-10-CM

## 2013-09-27 DIAGNOSIS — R7309 Other abnormal glucose: Secondary | ICD-10-CM | POA: Diagnosis not present

## 2013-09-27 DIAGNOSIS — G8929 Other chronic pain: Secondary | ICD-10-CM | POA: Diagnosis not present

## 2013-09-27 DIAGNOSIS — M549 Dorsalgia, unspecified: Secondary | ICD-10-CM | POA: Diagnosis not present

## 2013-09-27 DIAGNOSIS — J4520 Mild intermittent asthma, uncomplicated: Secondary | ICD-10-CM | POA: Insufficient documentation

## 2013-09-27 DIAGNOSIS — L309 Dermatitis, unspecified: Secondary | ICD-10-CM

## 2013-09-27 DIAGNOSIS — K589 Irritable bowel syndrome without diarrhea: Secondary | ICD-10-CM | POA: Insufficient documentation

## 2013-09-27 DIAGNOSIS — L259 Unspecified contact dermatitis, unspecified cause: Secondary | ICD-10-CM

## 2013-09-27 MED ORDER — OXYCODONE HCL ER 80 MG PO T12A: EXTENDED_RELEASE_TABLET | ORAL | Status: DC

## 2013-09-27 MED ORDER — OXYCODONE HCL 15 MG PO TABS: 15.0000 mg | ORAL_TABLET | ORAL | Status: DC | PRN

## 2013-09-27 MED ORDER — MOMETASONE FUROATE 50 MCG/ACT NA SUSP: 2.0000 | Freq: Every day | NASAL | Status: AC

## 2013-09-27 NOTE — Progress Notes (Signed)
   Subjective:    Patient ID: [name] [name], female    DOB: 1961/08/08, 53 y.o.   MRN: [id number]  HPI Patient is here today for a 3 month check up on chronic back pain.  She states her pain is tolerated with the Oxycotin. She has an appt at [facility] on Feb. 18th for pain management, but they told her they would not give her any pain meds on the first appt.  She would also like a med for her allergies. She c/o of HA, and drainage in the morning.     Review of Systems Low back tenderness arthralgias denies chest pain shortness of breath does relate some allergies    Objective:   Physical Exam  Her lungs are clear hearts regular pulse normal blood pressure good subjective discomfort in the lower portion of her back negative straight leg raise      Assessment & Plan:  #1 allergic rhinitis-steroid nasal spray prescribed #2 chronic back pain-fair control medication it helps her function she denies abusing it she's been on a high dose medication for long period of time. I started taking care of this patient's pain when she used to go to a pain management center but was unable to make that trip and afford the cost. She has been consistent with her followup with no underlying problems detected recently we informed her that our practice has decided to step away from management of chronic pain with individuals who are on large doses of medicine. It is difficult for primary care doctor to manage those issues plus also managed multiple other issues at the same time. Patient was informed for their own good it was best to seek the opinion of a pain management specialist. Therefore she will be seeing [facility] pain management in February. 60 days worth of medications were given. We certainly await the results of this consultation.

## 2013-10-01 ENCOUNTER — Telehealth: Payer: Self-pay | Admitting: Family Medicine

## 2013-10-01 NOTE — Telephone Encounter (Signed)
Pharmacist called and stated that Nasonex doesn't come in generic-flonase the only nasal spray that comes in generic-Consult with Dr Nicki Reaper- prescription switched to generic flonase. Patient notified. Patient also advised sx care and to call back if sx persist or worsen.

## 2013-10-01 NOTE — Telephone Encounter (Signed)
Wrote her a prescription for refill on Nasonex. Insurance won't pay for brand.  Needs you to call in Generic Nasonex so Insurance will pay. [facility]. Has developed a sore throat since she was here.

## 2013-10-05 ENCOUNTER — Telehealth: Payer: Self-pay | Admitting: Family Medicine

## 2013-10-05 NOTE — Telephone Encounter (Signed)
Ok may do so

## 2013-10-05 NOTE — Telephone Encounter (Signed)
Need refill sent to [facility] for TEMOVATE ointment, must be name brand only

## 2013-10-08 ENCOUNTER — Telehealth: Payer: Self-pay | Admitting: Family Medicine

## 2013-10-08 MED ORDER — TEMOVATE 0.05 % EX OINT: 1.0000 "application " | TOPICAL_OINTMENT | Freq: Two times a day (BID) | CUTANEOUS | Status: AC

## 2013-10-08 NOTE — Telephone Encounter (Signed)
The patient would certainly no what she was using but I believe it was Temovate 0.05% ointment apply twice a day. 60 g tube.(Once again the patient would know what she last got in terms of the size of the tube) certainly can be written for as name brand only on the prescription and it will need to be signed on this been as written side. The one thing we cannot guarantee is her insurance company will allow her to get name brand only

## 2013-10-08 NOTE — Telephone Encounter (Signed)
Need refill sent to [facility] for TEMOVATE ointment, must be name brand only   Prior Auth obtained for patient's TEMOVATE 0.05% ointment, expires 09/12/2014 through [employer]  Patient said this has not been called in. Please advise.

## 2013-10-08 NOTE — Telephone Encounter (Signed)
Med sent and pt notified  

## 2013-10-08 NOTE — Addendum Note (Signed)
Addended byCharolotte Capuchin D on: 10/08/2013 02:11 PM   Modules accepted: Orders

## 2013-10-08 NOTE — Telephone Encounter (Signed)
I thought this issue was taken care of. The patient prefers name brand only. I believe that has to be signed and faxed in. Someone please help with this thank you.

## 2013-11-14 ENCOUNTER — Other Ambulatory Visit: Payer: Self-pay | Admitting: Family Medicine

## 2013-11-14 NOTE — Telephone Encounter (Signed)
May have this +4 additional refills 

## 2013-11-14 NOTE — Telephone Encounter (Signed)
Seen 09/27/13

## 2013-11-16 ENCOUNTER — Ambulatory Visit: Payer: Medicare Other | Admitting: Family Medicine

## 2013-11-19 ENCOUNTER — Telehealth: Payer: Self-pay | Admitting: Family Medicine

## 2013-11-19 MED ORDER — OXYCODONE HCL 15 MG PO TABS: 15.0000 mg | ORAL_TABLET | ORAL | Status: DC | PRN

## 2013-11-19 MED ORDER — OXYCODONE HCL ER 80 MG PO T12A: EXTENDED_RELEASE_TABLET | ORAL | Status: DC

## 2013-11-19 NOTE — Telephone Encounter (Signed)
Patient states that she had to cancel her appointment at [facility] because it snowed on the day she was supposed to go. It is now in May. Notified scripts ready for pickup.

## 2013-11-19 NOTE — Telephone Encounter (Signed)
May give rx for meds, to fill 3/14, also status of paiun management with [facility]?

## 2013-11-19 NOTE — Telephone Encounter (Signed)
She will need OV in April before next script I suggest she go ahead and schedule

## 2013-11-19 NOTE — Telephone Encounter (Signed)
OxyCODONE (OXYCONTIN) 80 mg T12A 12 hr tablet oxyCODONE (ROXICODONE) 15 MG immediate release tablet  Please refill for patient she states she needs it before Sunday  Due to her pharmacy not being open on Sunday   Last seen 09/27/13 Last refill   09/27/13

## 2013-11-20 NOTE — Telephone Encounter (Signed)
Patient has appointment on 11/28/13

## 2013-11-23 ENCOUNTER — Other Ambulatory Visit: Payer: Self-pay | Admitting: Family Medicine

## 2013-11-23 NOTE — Telephone Encounter (Signed)
Seen 09/27/13 

## 2013-11-25 NOTE — Telephone Encounter (Signed)
May approve each x4

## 2013-11-28 ENCOUNTER — Ambulatory Visit: Payer: Medicare Other | Admitting: Family Medicine

## 2013-12-18 ENCOUNTER — Telehealth: Payer: Self-pay | Admitting: Family Medicine

## 2013-12-18 NOTE — Telephone Encounter (Signed)
Please find out where she got her last medicine filled. Verify the date. Verify the amount. Then I will authorize riding the next 30 day prescription. This patient should have a pain management visit scheduled in May at [facility]. Please verify the date of this. More than likely we will have the patient followup with [place] also in May of 2015. Once again all this information forwarded back to me thank you

## 2013-12-18 NOTE — Telephone Encounter (Signed)
Pt called to request refill on her pain meds, please call when ready so she can have her daughter pick up Rx's  OxyCODONE (OXYCONTIN) 80 mg  oxyCODONE (ROXICODONE) 15 MG   Pt does not have a 3 month follow up appointment scheduled with [place], last office visit 09/27/13

## 2013-12-19 NOTE — Telephone Encounter (Signed)
If the pain clinic goes ahead and assumes her pain medicine control we will not need to see her in may.

## 2013-12-19 NOTE — Telephone Encounter (Signed)
Left message on voicemail to return call.

## 2013-12-19 NOTE — Telephone Encounter (Signed)
It would be fine to go ahead and fill both of these prescriptions that may be filled on 12/23/2013. She will probably need an additional prescription of pain medications in May I would recommend that we see her in the first part of May.

## 2013-12-19 NOTE — Telephone Encounter (Signed)
Patient states that she gets the medication from [facility]. States she last got it filled on 11/23/13 80 mg #180 and 15 mg # 90. Pain clinic appointment is for May 1st.

## 2013-12-20 MED ORDER — OXYCODONE HCL ER 80 MG PO T12A: EXTENDED_RELEASE_TABLET | ORAL | Status: DC

## 2013-12-20 MED ORDER — OXYCODONE HCL 15 MG PO TABS: 15.0000 mg | ORAL_TABLET | ORAL | Status: DC | PRN

## 2013-12-20 NOTE — Telephone Encounter (Signed)
Left message on voicemail notifying patient scripts are ready for pickup and if additional are needed for May then she will need office visit.

## 2014-01-11 ENCOUNTER — Telehealth: Payer: Self-pay | Admitting: Family Medicine

## 2014-01-11 NOTE — Telephone Encounter (Signed)
Pt called, stated that she's at the [facility] pain management office but due to getting lost on her way there, she arrived 25 minutes late for her appointment, they told her she would have to reschedule, now she's requesting referral to pain management in [place] or [place], please advise

## 2014-01-11 NOTE — Telephone Encounter (Signed)
Pt called back shortly after first call to say that she's rescheduled for 03/26/14 & is also on a cancellation list and may be called to come in sooner, has now decided to just keep appointment there at [facility], please disregard earlier request for new referral

## 2014-01-13 NOTE — Telephone Encounter (Signed)
Please set her up with either Dr.[place] or [facility]. Her chronic pain disorder that requires long-acting pain medication is outside of my long-term scope of practice that is why I am referring her.

## 2014-01-15 NOTE — Telephone Encounter (Signed)
Prescriptions on pain medications. Even though she will be seen [facility] in July as long as we are prescribing here we do have to show that we are monitoring appropriately. When she calms I want her to bring her  most recent pain prescription bottles with her and any pills that she has of these medicines.

## 2014-01-15 NOTE — Telephone Encounter (Signed)
Will hold off on referral due to message below from pt, she will stick with [facility] pain management, they currently have her scheduled for 03/26/14 and also is on a cancellation list and may be called to come in sooner, if pt decides later for a more local pain management doc, will at that time process referral to Dr. Merlene Laughter or [facility]

## 2014-01-16 NOTE — Telephone Encounter (Signed)
[facility]- Needs office visit and bring pain prescription bottles with her to the visit.

## 2014-01-17 NOTE — Telephone Encounter (Signed)
Transferred to front desk to scheduled appointment for Monday per Dr. Nicki Reaper.

## 2014-01-21 ENCOUNTER — Encounter: Payer: Self-pay | Admitting: Family Medicine

## 2014-01-21 ENCOUNTER — Ambulatory Visit (INDEPENDENT_AMBULATORY_CARE_PROVIDER_SITE_OTHER): Payer: Medicare Other | Admitting: Family Medicine

## 2014-01-21 VITALS — BP 124/80 | Temp 98.2°F | Ht 69.5 in | Wt 181.5 lb

## 2014-01-21 DIAGNOSIS — R7309 Other abnormal glucose: Secondary | ICD-10-CM | POA: Diagnosis not present

## 2014-01-21 DIAGNOSIS — E785 Hyperlipidemia, unspecified: Secondary | ICD-10-CM | POA: Diagnosis not present

## 2014-01-21 DIAGNOSIS — G8929 Other chronic pain: Secondary | ICD-10-CM | POA: Diagnosis not present

## 2014-01-21 DIAGNOSIS — R739 Hyperglycemia, unspecified: Secondary | ICD-10-CM

## 2014-01-21 DIAGNOSIS — D510 Vitamin B12 deficiency anemia due to intrinsic factor deficiency: Secondary | ICD-10-CM

## 2014-01-21 DIAGNOSIS — M549 Dorsalgia, unspecified: Secondary | ICD-10-CM | POA: Diagnosis not present

## 2014-01-21 DIAGNOSIS — Z79899 Other long term (current) drug therapy: Secondary | ICD-10-CM

## 2014-01-21 MED ORDER — OXYCODONE HCL ER 80 MG PO T12A: EXTENDED_RELEASE_TABLET | ORAL | Status: DC

## 2014-01-21 MED ORDER — OXYCODONE HCL 15 MG PO TABS: 15.0000 mg | ORAL_TABLET | ORAL | Status: DC | PRN

## 2014-01-21 NOTE — Progress Notes (Signed)
Subjective:    Patient ID: [name] [name], female    DOB: 1960-12-05, 53 y.o.   MRN: [id number]  HPI Patient is here today for her chronic pain follow up visit. Patient did not bring her prescription pain bottles with her today like she was advised to do.  Patient states that she has been vomiting, diarrhea and nauseated. This has been present for about 2 days now. This appears to be a viral illness this was discussed in detail she states she did not run out of her pain medicines  She does have some intermittent left arm pain described burning discomfort radiates down the arm toward the elbow. No weakness in the hand  This patient was seen today for chronic pain  The medication list was reviewed and updated.  Discussion was held with the patient regarding compliance with pain medication. The patient was advised the importance of maintaining medication and not using illegal substances with these. The patient was educated that we can provide 3 monthly scripts for their medication, it is their responsibility to follow the instructions. Discussion was held with the patient to make sure they're not having significant side effects. Patient is aware that pain medications are meant to minimize the severity of the pain to allow their pain levels to improve to allow for better function. They are aware of that pain medications cannot totally remove their pain.   She denies abusing her pain medicine she does state that she is open toward having less pain medicine. We discussed in detail the importance of getting for she'll think reducing [employer] medicine she is on with the hopes of not being on his much I advised her to consider go ahead and start using to her long-acting pain medicines in the morning 1 in the afternoon into any initially successful he keeps at well then try going to 2 in the morning 2 in the evening several more weeks down the road. I also told her when she sees a pain medicine specialist  they will probably want to try a different approach to get her pain under reasonable control but hopefully less medicine   Review of Systems  Constitutional: Negative for activity change, appetite change and fatigue.  Gastrointestinal: Negative for abdominal pain.  Musculoskeletal: Positive for back pain. Negative for neck stiffness.  Neurological: Negative for dizziness and headaches.  Psychiatric/Behavioral: Negative for behavioral problems.       Objective:   Physical Exam  Vitals reviewed. Constitutional: She appears well-nourished. No distress.  Cardiovascular: Normal rate, regular rhythm and normal heart sounds.   No murmur heard. Pulmonary/Chest: Effort normal and breath sounds normal. No respiratory distress.  Musculoskeletal: She exhibits no edema.  Lymphadenopathy:    She has no cervical adenopathy.  Neurological: She is alert. She exhibits normal muscle tone.  Psychiatric: Her behavior is normal.          Assessment & Plan:  1. Chronic back pain She has chronic back pain she is on significant amount of pain medication she's been counseled strongly to never take more than what was prescribed. I went ahead and gave HER-2/prescriptions for the next 2 months her neck this prescription written by [place] would be 03/22/2014.   She has an appointment with pain management on July 14. I told the patient is in her best interest to try to be on a lower dose of medicine she will try changing long-acting pain medicine 2 in the morning 1 at lunch and 2 in the evening  Patient was warned that there is a risk of accidental overdose with Ms. taking the medication plus also a risk of dying from the medication by taking too much 2. Pernicious anemia We will check B12 level she does her own B12 injections at home she states the knee medication it is hard to get all of the medicine out of the bile there really isn't much we can do about how it is manufactured  3. Hyperlipidemia She has a  strong family history heart disease we're look at her cholesterol. Encouraged to eat properly  4. Hyperglycemia Screening for hyperglycemia last blood test look good  She will notify us in early July regarding her next prescription. The day for the next prescription is to be filled on July 10. She does have an appointment with pain management July 14 if it is a she will let us know. I have informed her that we will not be riding the medication long-term.

## 2014-02-18 ENCOUNTER — Telehealth: Payer: Self-pay | Admitting: *Deleted

## 2014-02-18 ENCOUNTER — Telehealth: Payer: Self-pay | Admitting: Family Medicine

## 2014-02-18 NOTE — Telephone Encounter (Signed)
Patient notified

## 2014-02-18 NOTE — Telephone Encounter (Signed)
Dr. Nicki Reaper wants to talk with pharm.

## 2014-02-18 NOTE — Telephone Encounter (Signed)
Pt is due June 10th. Last filled may 11th. Pharm notified.

## 2014-02-18 NOTE — Telephone Encounter (Signed)
Pt wants to fill pain med on June 9th instead of June 10th when it is due. Last fill may 11th per pharm. [facility] uses [facility] in [place]. [phone].

## 2014-02-18 NOTE — Telephone Encounter (Signed)
Spoke with pharmacist she was given 30 day supply on May 11, her next script can not be until June 10

## 2014-02-18 NOTE — Telephone Encounter (Signed)
Oxycotin, oxycodone  Pt needs to refill these two meds and due to the month being  31 days last month she is short one day. She needs to refill them  Tomorrow instead of the 11th but the script says the 11th.   what should the pt do?

## 2014-03-19 ENCOUNTER — Telehealth: Payer: Self-pay | Admitting: Family Medicine

## 2014-03-19 MED ORDER — OXYCODONE HCL ER 80 MG PO T12A: EXTENDED_RELEASE_TABLET | ORAL | Status: DC

## 2014-03-19 MED ORDER — OXYCODONE HCL 15 MG PO TABS: 15.0000 mg | ORAL_TABLET | ORAL | Status: DC | PRN

## 2014-03-19 NOTE — Telephone Encounter (Signed)
Typically pain management will not prescribe on the initial visit. May prescribe one month's supply of each. Patient must keep appointment with pain management.

## 2014-03-19 NOTE — Telephone Encounter (Signed)
Correction 3 weeks' supply

## 2014-03-19 NOTE — Telephone Encounter (Signed)
Patient says that her pain management appointment is still on 03/26/14 and is requesting a refill on her oxycodone and oxycontin.

## 2014-03-19 NOTE — Telephone Encounter (Signed)
Rx printed and left up front for patient pick up. Patient notified and advised to make sure she keeps her appt with pain management 03/26/14.

## 2014-03-19 NOTE — Telephone Encounter (Addendum)
Last filled 02/20/14 and due 03/21/14 Pain management appt 03/26/14 do you want to do a week supply

## 2014-03-20 ENCOUNTER — Telehealth: Payer: Self-pay | Admitting: Family Medicine

## 2014-03-20 NOTE — Telephone Encounter (Signed)
Patient seeing Dr. Jeneen Rinks at [facility] 03/26/14 - Phone # [phone]

## 2014-03-20 NOTE — Telephone Encounter (Signed)
Advised patient that we have done a 3 week supply for her to pick up-(see phone message 03/20/14) and that we advise she sees pain management as scheduled 03/26/14.

## 2014-03-20 NOTE — Telephone Encounter (Signed)
Patient said pain management will absolutely will not write a prescription the first appointment that she has with them on March 26, 2014. She called them and they said this is strictly a consultation to see if they will even take her on as a pain management patient. Dr. Jeneen Rinks at this clinic said that the PCP is to continue writing the medication until further notified. He also said that he could not contact Dr. Nicki Reaper and speak with him about this, but he can call him.   Also, patient contacted her insurance company and said that they would not cover 2 prescriptions wrote for 1 month, so the Rx for her pain medication would have to be a 30 day supply.  She said since she is in [place], [place] she is hoping this message can be completed soon because she doesn't want to pick up a 21 day supply when she needs a 30 day supply.

## 2014-03-26 DIAGNOSIS — M545 Low back pain, unspecified: Secondary | ICD-10-CM | POA: Diagnosis not present

## 2014-03-26 DIAGNOSIS — R29898 Other symptoms and signs involving the musculoskeletal system: Secondary | ICD-10-CM | POA: Diagnosis not present

## 2014-03-26 DIAGNOSIS — R209 Unspecified disturbances of skin sensation: Secondary | ICD-10-CM | POA: Diagnosis not present

## 2014-03-26 DIAGNOSIS — Z79899 Other long term (current) drug therapy: Secondary | ICD-10-CM | POA: Diagnosis not present

## 2014-03-26 DIAGNOSIS — M961 Postlaminectomy syndrome, not elsewhere classified: Secondary | ICD-10-CM | POA: Diagnosis not present

## 2014-03-26 DIAGNOSIS — E538 Deficiency of other specified B group vitamins: Secondary | ICD-10-CM | POA: Diagnosis not present

## 2014-03-27 ENCOUNTER — Telehealth: Payer: Self-pay | Admitting: Family Medicine

## 2014-03-27 NOTE — Telephone Encounter (Signed)
Autumn spoke with patient and discussed information below.

## 2014-03-27 NOTE — Telephone Encounter (Signed)
Pt has seen the Pain Mgmt Doc today, is now wondering what she is to do  About her refill when it comes up in a few weeks? The pain doc stated No one from our office has contacted them yet.  Advised we would be doing so  Pt does not want to speak with a nurse she wants to speak with you directly   Please advise   This is a Dr Jeneen Rinks that's the female

## 2014-03-27 NOTE — Telephone Encounter (Signed)
Please call the patient, find out what the specialist at [facility] said. Tell the patient that I will not be calling her and so I speak with that specialist. We put a call in today unable to reach them. We will put her call back in tomorrow.

## 2014-03-27 NOTE — Telephone Encounter (Signed)
Dr. Jeneen Rinks at [facility] - Phone # [phone]

## 2014-03-29 ENCOUNTER — Other Ambulatory Visit: Payer: Self-pay | Admitting: *Deleted

## 2014-03-29 NOTE — Telephone Encounter (Signed)
I have discussed her case with the specialist. They said that they will be evaluating her further in September then after that deciding what medication approach they will be doing for her pain. She will be running out of her pain medicine in the near future. She got a three-week prescription so she will probably need a 30 day prescription after that. Talk with the patient find out where she got her last prescription filled. I would like to be a lot handled week issuing her prescriptions before July 24 so that this issue will be handled before I have to go on a break. She will need to be seen here at our office in August before issuing her prescriptions for the following 30 days.

## 2014-03-31 NOTE — Telephone Encounter (Signed)
It should be noted that the patient was last seen in our clinic in May. She will be due office visit in August.(When she comes in I will be discussing with her the aspect that the pain management center at [facility] will most likely be trying to streamline her treatment. I will also discuss opioid hyperalgesia.) She will need a followup here in August. She will also need to keep her appointment in September with the specialist. The specialist I spoke with stated that they will let us know when they start assuming her prescriptions. (They stated that they will more than likely be reducing that overall opioid use in going to a different medication)

## 2014-04-02 NOTE — Telephone Encounter (Signed)
The earliest that the specialist will assume the prescriptions is late September or possibly October. We will need to provide her the medications through then. On a recent visit she was given a three-week prescription because it was thought that the specialist would be assuming this part of her care. They did not at this point. Please call her prescription insurance plan- [employer]- [phone] (patient's ID number is [id number]) (patient's phone number is [phone]) please see if they will allow for her medicine to be re\re issued for 30 days and be covered. If it will not be covered we'll they cover it if she is placed on a different strength? Or a different product?

## 2014-04-03 NOTE — Telephone Encounter (Signed)
Patient worse did call the insurance drug plan. They stated that the pharmacy ran the prescription like a 30 day supply because the number of days were not specified on the prescription. They stated a solution for this would be for [place] to go ahead and write a prescription and specified on the prescription that this number is for 30 days and that the previous was for 21 days. This prescription could be filled on 04/10/2014. A letter detailing this is also done. I would recommend for the patient pick up her prescription as well as the letter. It would be wise for her to talk with the pharmacy ahead of time. The pharmacy when they tried to fill it we'll get a rejection stating"filled too soon". But when made call the insurance company for a dose change to fix the number of days for the new prescription this should be able to be corrected. The insurance company stated that there is no special forms or approval numbers that have to be done ahead of time. (Nurses-please write out a 30 day prescription for her OxyContin as well as her oxycodone. Please write on there that these may be filled on 04/10/2014. Also write on the prescription that these are for 30 days each.) Thanks

## 2014-04-04 MED ORDER — OXYCODONE HCL 15 MG PO TABS: 15.0000 mg | ORAL_TABLET | ORAL | Status: DC | PRN

## 2014-04-04 MED ORDER — OXYCODONE HCL ER 80 MG PO T12A: EXTENDED_RELEASE_TABLET | ORAL | Status: DC

## 2014-04-04 NOTE — Telephone Encounter (Signed)
Patient notified and verbalized understanding. 

## 2014-04-04 NOTE — Addendum Note (Signed)
Addended byCharolotte Capuchin D on: 04/04/2014 09:59 AM   Modules accepted: Orders

## 2014-04-30 ENCOUNTER — Telehealth: Payer: Self-pay | Admitting: Family Medicine

## 2014-04-30 NOTE — Telephone Encounter (Signed)
Patient stated she will pick up RX at office visit.

## 2014-04-30 NOTE — Telephone Encounter (Signed)
Last RXs filled 04/10/14 for both- 11 days early per pharmacy- can be filled 05/10/14 per pharmacy.  Patient has an appt with you for pain management 05/10/14 -the day rxs are due.

## 2014-04-30 NOTE — Telephone Encounter (Signed)
noted 

## 2014-04-30 NOTE — Telephone Encounter (Signed)
Patient needs Rx for oxycodone and oxycontin.

## 2014-05-10 ENCOUNTER — Ambulatory Visit (INDEPENDENT_AMBULATORY_CARE_PROVIDER_SITE_OTHER): Payer: Medicare Other | Admitting: Family Medicine

## 2014-05-10 ENCOUNTER — Encounter: Payer: Self-pay | Admitting: Family Medicine

## 2014-05-10 VITALS — BP 130/82 | Ht 69.5 in | Wt 186.0 lb

## 2014-05-10 DIAGNOSIS — R609 Edema, unspecified: Secondary | ICD-10-CM | POA: Diagnosis not present

## 2014-05-10 DIAGNOSIS — M549 Dorsalgia, unspecified: Secondary | ICD-10-CM

## 2014-05-10 DIAGNOSIS — N390 Urinary tract infection, site not specified: Secondary | ICD-10-CM

## 2014-05-10 DIAGNOSIS — G8929 Other chronic pain: Secondary | ICD-10-CM

## 2014-05-10 DIAGNOSIS — R600 Localized edema: Secondary | ICD-10-CM

## 2014-05-10 LAB — POCT URINALYSIS DIPSTICK
RBC UA: 50
Spec Grav, UA: 1.02
pH, UA: 7.5

## 2014-05-10 MED ORDER — OXYCODONE HCL ER 80 MG PO T12A: EXTENDED_RELEASE_TABLET | ORAL | Status: DC

## 2014-05-10 MED ORDER — ALPRAZOLAM 1 MG PO TABS: ORAL_TABLET | ORAL | Status: DC

## 2014-05-10 MED ORDER — OXYCODONE HCL 15 MG PO TABS: 15.0000 mg | ORAL_TABLET | ORAL | Status: DC | PRN

## 2014-05-10 NOTE — Progress Notes (Signed)
   Subjective:    Patient ID: [name] [name], female    DOB: [birth year], 53 y.o.   MRN: [id number]  HPI  Patient is here today for a 3 month check up.patient states she is having urgency, fluid retention & leaking form laughter, coughing or even a sneeze. Patient states these are concerns today.  This patient was seen today for chronic pain  The medication list was reviewed and updated.   -Compliance with pain medication: Good  The patient was advised the importance of maintaining medication and not using illegal substances with these.  Refills needed: Yes  The patient was educated that we can provide 3 monthly scripts for their medication, it is their responsibility to follow the instructions.  Side effects or complications from medications: None  Patient is aware that pain medications are meant to minimize the severity of the pain to allow their pain levels to improve to allow for better function. They are aware of that pain medications cannot totally remove their pain.  Due for UDT ( at least once per year) : She will be under the care of [facility] coming up.        Review of Systems She denies chest pressure PND orthopnea denies tightness shortness of breath she does relate some swelling in the legs    Objective:   Physical Exam  Lungs are clear heart is regular pulse normal pedal edema is noted lower leg edema is noted there is no crackles in the lungs a sound clear      Assessment & Plan:  Possible stress incontinence versus overactive bladder she states she has to get up at night to urinate at least 3 times. She denies excessive thirst. Denies dysuria urinalysis under the microscope is unimpressive. We will culture this.  Chronic pain-refills of her medicine were given. She is to see the psychologist in early septum number at [facility] then she will see the specialists later that month. She will need her medication before she sees the specialist. We will try to coordinate with  the specialists regarding her medicine. Eventually they will assume control over her pain medication.  Pedal edema probably related to a combination of the OxyContin, inactivity, riding in a car a lot. She is to try to keep her feet propped up when possible. Use knee-high compression hose.  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 This will carry her through for an additional 30 days.

## 2014-05-12 LAB — URINE CULTURE: Colony Count: 30000

## 2014-06-03 ENCOUNTER — Telehealth: Payer: Self-pay | Admitting: Family Medicine

## 2014-06-03 ENCOUNTER — Other Ambulatory Visit: Payer: Self-pay | Admitting: *Deleted

## 2014-06-03 MED ORDER — OXYCODONE HCL 15 MG PO TABS: 15.0000 mg | ORAL_TABLET | ORAL | Status: DC | PRN

## 2014-06-03 MED ORDER — OXYCODONE HCL ER 80 MG PO T12A: EXTENDED_RELEASE_TABLET | ORAL | Status: DC

## 2014-06-03 NOTE — Telephone Encounter (Signed)
Both of these medicines may be dated for September 27. Also this is going to be under good control of pain management at [facility] after this prescription.

## 2014-06-03 NOTE — Telephone Encounter (Signed)
Patient needs refill on oxycotin 80mg , oxycodone15mg 

## 2014-06-03 NOTE — Telephone Encounter (Signed)
Last refilled

## 2014-06-03 NOTE — Telephone Encounter (Signed)
Last filled 05/10/14- currently 7 days early

## 2014-06-03 NOTE — Telephone Encounter (Signed)
Pt notified on voicemail scripts are ready for pickup. Note with scripts that meds can be filled sept 27th and the next scripts should be under pain management.

## 2014-06-06 ENCOUNTER — Encounter: Payer: Self-pay | Admitting: *Deleted

## 2014-06-06 ENCOUNTER — Telehealth: Payer: Self-pay | Admitting: Family Medicine

## 2014-06-06 NOTE — Telephone Encounter (Addendum)
Pt states her script for oxycontin and oxycodone were dated wrong. She states she is due on sept 26th. Scripts dated for the 27th which is Sunday. Her pharm is closed on Sunday. I called pharm and they said they would only fill on Saturday with dr's approval. Pt fill pain meds at [facility] 7/29 and filled meds at [place] [facility] 8/28. i told pt she was not due til 9/27 which is the way the script was dated but she states she will be out on 9/26 because she was completely out when she came in on 8/28.

## 2014-06-06 NOTE — Telephone Encounter (Signed)
Patients daughter picked up her pain medication Rx today. Patient called back and said that they were dated wrong. She said she had the last Rx filled the same day that she was here, so the fill date should not be 06/09/14. Please advise.

## 2014-06-06 NOTE — Telephone Encounter (Signed)
Go ahead and authorize one day ahead

## 2014-06-07 NOTE — Telephone Encounter (Signed)
Notified pharmacy and patient that medication can be filled a day early.

## 2014-07-03 ENCOUNTER — Telehealth: Payer: Self-pay | Admitting: Family Medicine

## 2014-07-03 MED ORDER — OXYCODONE HCL ER 80 MG PO T12A: EXTENDED_RELEASE_TABLET | ORAL | Status: DC

## 2014-07-03 MED ORDER — OXYCODONE HCL 15 MG PO TABS: 15.0000 mg | ORAL_TABLET | ORAL | Status: DC | PRN

## 2014-07-03 NOTE — Telephone Encounter (Signed)
She may have refill on each prescription but please verify from her pharmacy when she got each prescription filled so that these need scripts can be properly dated. Also it is mandatory for the patient to keep her appointment in November

## 2014-07-03 NOTE — Telephone Encounter (Signed)
Rx up front for patient pick up. Patient notified. 

## 2014-07-03 NOTE — Telephone Encounter (Signed)
Last filled 06/09/14 per pharmacy

## 2014-07-03 NOTE — Telephone Encounter (Signed)
oxyCODONE (ROXICODONE) 15 MG immediate release tablet OxyCODONE (OXYCONTIN) 80 mg T12A 12 hr tablet  Last filled 06/09/14 according to pt  Has appt for 11/18 here   Will see pain Mgmt 12/28 and 12/30 (states the one she was seeing had  To be taken out on maternity leave)

## 2014-07-11 ENCOUNTER — Telehealth: Payer: Self-pay | Admitting: Family Medicine

## 2014-07-11 MED ORDER — BENZONATATE 200 MG PO CAPS: 200.0000 mg | ORAL_CAPSULE | Freq: Three times a day (TID) | ORAL | Status: DC | PRN

## 2014-07-11 MED ORDER — PROMETHAZINE HCL 25 MG PO TABS: 25.0000 mg | ORAL_TABLET | Freq: Three times a day (TID) | ORAL | Status: AC | PRN

## 2014-07-11 NOTE — Telephone Encounter (Signed)
Tessalon 200 mg, 1 tid prn , #21,2 refills, phenergan 25 mg one tid prn nausea, #15,1 refill, f/u if ongoing or worse

## 2014-07-11 NOTE — Telephone Encounter (Signed)
Patient is requesting Rx for phenergan.  She thinks she may have a stomach virus.  Also, she has a sore throat, stopped up head, and cough.  At this point she think its viral, but if we could call in something for a cough that would be great.   [place]

## 2014-07-11 NOTE — Telephone Encounter (Signed)
Medication sent to pharmacy. Patient was notified.  

## 2014-07-12 ENCOUNTER — Telehealth: Payer: Self-pay | Admitting: Family Medicine

## 2014-07-12 NOTE — Telephone Encounter (Signed)
Pt's benzonatate (TESSALON) 200 MG capsule is not covered, spoke with Taniece @ [facility] & she explained that it's not covered by Medicare Part D at all, called & notified pt and pt requests that we send in Rx for something cheaper for her to pay for out of pocket, states the pharmacist recommended something "DM" - please advise & call pt when done [phone] Please send Rx to [facility] on [address] in [place]

## 2014-07-12 NOTE — Telephone Encounter (Signed)
Discussed with pt. Pt states she has had cough, wheezing, chest congestion, fever for the past 4 days. Consult with Dr. Nicki Reaper. Pt advised to go to directly to ED or urgent care.

## 2014-07-12 NOTE — Telephone Encounter (Signed)
[facility]

## 2014-07-15 DIAGNOSIS — J449 Chronic obstructive pulmonary disease, unspecified: Secondary | ICD-10-CM | POA: Diagnosis not present

## 2014-07-15 DIAGNOSIS — J440 Chronic obstructive pulmonary disease with acute lower respiratory infection: Secondary | ICD-10-CM | POA: Diagnosis not present

## 2014-07-15 DIAGNOSIS — R0789 Other chest pain: Secondary | ICD-10-CM | POA: Diagnosis not present

## 2014-07-15 DIAGNOSIS — Z720 Tobacco use: Secondary | ICD-10-CM | POA: Diagnosis not present

## 2014-07-15 DIAGNOSIS — J45909 Unspecified asthma, uncomplicated: Secondary | ICD-10-CM | POA: Diagnosis not present

## 2014-07-15 DIAGNOSIS — J209 Acute bronchitis, unspecified: Secondary | ICD-10-CM | POA: Diagnosis not present

## 2014-07-22 ENCOUNTER — Telehealth: Payer: Self-pay | Admitting: Family Medicine

## 2014-07-22 DIAGNOSIS — R059 Cough, unspecified: Secondary | ICD-10-CM

## 2014-07-22 DIAGNOSIS — R05 Cough: Secondary | ICD-10-CM

## 2014-07-22 NOTE — Telephone Encounter (Signed)
Per Dr. Nicki Reaper, I put in STAT x-ray of chest to be done in the morning and for the pt to f/u with Dr. Nicki Reaper tomorrow 11/10

## 2014-07-22 NOTE — Telephone Encounter (Signed)
Pt went to ER last week and was diagnosed with bronchitis, asthma and beginning of COPD.Marland Kitchen She has tried oxygen treatment, albuterol inhaler, prednisone and zithromax.  She is still having terrible trouble breathing, cough and hard to get up mucous.  She wants to know if she can get an x-ray done to make sure its not pneumonia.

## 2014-07-23 ENCOUNTER — Ambulatory Visit: Payer: Medicare Other | Admitting: Family Medicine

## 2014-07-23 ENCOUNTER — Telehealth: Payer: Self-pay | Admitting: *Deleted

## 2014-07-23 DIAGNOSIS — R05 Cough: Secondary | ICD-10-CM

## 2014-07-23 DIAGNOSIS — R059 Cough, unspecified: Secondary | ICD-10-CM

## 2014-07-23 NOTE — Telephone Encounter (Signed)
Patient called yesterday to say that she went to ER last week and was diagnosed with bronchitis, asthma and beginning of COPD.Marland Kitchen She has tried oxygen treatment, albuterol inhaler, prednisone and zithromax. She is still having terrible trouble breathing, cough and hard to get up mucous. She wants to know if she can get an x-ray done to make sure its not pneumonia.  Per Dr. Nicki Reaper, I put in STAT x-ray of chest to be done in the morning and for the pt to f/u with Dr. Nicki Reaper on 11/10.  Pt called this morning saying she cannot get the xray done or come here for f/u appt. Pt said she is still SOB and having difficulty with breathing. I told her she needs to go to urgent care or the ER and to not wait until Thursday to be seen (that is when she is wanting to be seen). With those type of s/s, I urged her she needed to go to urgent care and not wait. She verbalized understanding.

## 2014-07-25 ENCOUNTER — Ambulatory Visit: Payer: Medicare Other | Admitting: Family Medicine

## 2014-07-31 ENCOUNTER — Encounter: Payer: Self-pay | Admitting: Family Medicine

## 2014-07-31 ENCOUNTER — Ambulatory Visit (INDEPENDENT_AMBULATORY_CARE_PROVIDER_SITE_OTHER): Payer: Medicare Other | Admitting: Family Medicine

## 2014-07-31 VITALS — BP 138/90 | Ht 69.5 in | Wt 186.0 lb

## 2014-07-31 DIAGNOSIS — J208 Acute bronchitis due to other specified organisms: Secondary | ICD-10-CM

## 2014-07-31 DIAGNOSIS — Z23 Encounter for immunization: Secondary | ICD-10-CM | POA: Diagnosis not present

## 2014-07-31 DIAGNOSIS — G8929 Other chronic pain: Secondary | ICD-10-CM | POA: Diagnosis not present

## 2014-07-31 DIAGNOSIS — M549 Dorsalgia, unspecified: Secondary | ICD-10-CM

## 2014-07-31 MED ORDER — VARENICLINE TARTRATE 0.5 MG X 11 & 1 MG X 42 PO MISC: ORAL | Status: AC

## 2014-07-31 MED ORDER — OXYCODONE HCL 15 MG PO TABS: 15.0000 mg | ORAL_TABLET | ORAL | Status: AC | PRN

## 2014-07-31 MED ORDER — VARENICLINE TARTRATE 1 MG PO TABS: 1.0000 mg | ORAL_TABLET | Freq: Two times a day (BID) | ORAL | Status: AC

## 2014-07-31 MED ORDER — OXYCODONE HCL ER 80 MG PO T12A: EXTENDED_RELEASE_TABLET | ORAL | Status: DC

## 2014-07-31 MED ORDER — OXYCODONE HCL 15 MG PO TABS: 15.0000 mg | ORAL_TABLET | ORAL | Status: DC | PRN

## 2014-07-31 MED ORDER — LEVOFLOXACIN 500 MG PO TABS: 500.0000 mg | ORAL_TABLET | Freq: Every day | ORAL | Status: DC

## 2014-07-31 MED ORDER — LEVOFLOXACIN 500 MG PO TABS: 500.0000 mg | ORAL_TABLET | Freq: Every day | ORAL | Status: AC

## 2014-07-31 MED ORDER — OXYCODONE HCL ER 80 MG PO T12A: EXTENDED_RELEASE_TABLET | ORAL | Status: AC

## 2014-07-31 NOTE — Patient Instructions (Signed)

## 2014-07-31 NOTE — Progress Notes (Signed)
   Subjective:    Patient ID: [name] [name], female    DOB: [birth year], 53 y.o.   MRN: [id number]  HPI This patient was seen today for chronic pain  The medication list was reviewed and updated.   -Compliance with pain medication: Yes  The patient was advised the importance of maintaining medication and not using illegal substances with these.  Refills needed: Yes  The patient was educated that we can provide 3 monthly scripts for their medication, it is their responsibility to follow the instructions.  Side effects or complications from medications: No  Patient is aware that pain medications are meant to minimize the severity of the pain to allow their pain levels to improve to allow for better function. They are aware of that pain medications cannot totally remove their pain.  Due for UDT ( at least once per year) : next visit  Pt also wants to discuss her ER visit d/t cough and difficulty with breathing. She said she is not any better. Went to ER in [place] about 2 weeks ago.         Review of Systems     Objective:   Physical Exam Lungs are clear heart regular low back subjective back pain and discomfort She is not respiratory distress but relates a fair amount of coughing      Assessment & Plan:  #1 quitting smoking she was counseled Chantix prescribed side effects discussed stop if any significant side effects  Viral syndrome secondary bronchitis Levaquin albuterol if progressive troubles notify us  Chronic pain she is seeing Dr. at [facility] for pain management this will be at the end of December or started January 2 prescriptions were given today. Her pharmacy in [place] was called to make certain that she did not get medications to soon

## 2014-08-15 ENCOUNTER — Other Ambulatory Visit: Payer: Self-pay | Admitting: *Deleted

## 2014-08-15 MED ORDER — PANTOPRAZOLE SODIUM 40 MG PO TBEC: 40.0000 mg | DELAYED_RELEASE_TABLET | Freq: Every day | ORAL | Status: AC

## 2014-08-15 MED ORDER — LUBIPROSTONE 24 MCG PO CAPS: 24.0000 ug | ORAL_CAPSULE | Freq: Two times a day (BID) | ORAL | Status: AC

## 2014-08-29 ENCOUNTER — Telehealth: Payer: Self-pay | Admitting: Family Medicine

## 2014-08-29 MED ORDER — FLUCONAZOLE 150 MG PO TABS: ORAL_TABLET | ORAL | Status: AC

## 2014-08-29 NOTE — Telephone Encounter (Signed)
This patient's situation seems to drag on. It would be fine for her to get this filled on the 23rd but the medication is for 12/26 through the following 30 days. Her next prescription could be filled on January 25. As long as she understands this I am fine with the pharmacy filling it on the 23rd

## 2014-08-29 NOTE — Telephone Encounter (Signed)
Patient has a yeast infection.  She would like something called in to [facility] that is listed on her file.   Also, she said her pain medication is due to be filled on 09/07/14.  She said her pharmacy is closed and would like this to be filled on 09/04/14 if anyway possible.

## 2014-08-29 NOTE — Telephone Encounter (Signed)
Rx for Diflucan sent in electronically to pharmacy.  Spoke with pharmacist  and they will close at lunch 09/05/14 and reopen 09/09/14 Patient states she has appt with pain management 09/09/14 and 09/11/14.

## 2014-08-30 NOTE — Telephone Encounter (Signed)
Discussed with pharmacy and they stated that they will fill it on 09/05/14 because they are open that day. Left message on voicemail notifying patient and also the next prescription cannot be filled until 30 days following 09/07/14.

## 2014-09-09 DIAGNOSIS — F419 Anxiety disorder, unspecified: Secondary | ICD-10-CM | POA: Diagnosis not present

## 2014-09-11 DIAGNOSIS — M961 Postlaminectomy syndrome, not elsewhere classified: Secondary | ICD-10-CM | POA: Diagnosis not present

## 2014-09-11 DIAGNOSIS — M545 Low back pain: Secondary | ICD-10-CM | POA: Diagnosis not present

## 2014-09-11 DIAGNOSIS — Z79899 Other long term (current) drug therapy: Secondary | ICD-10-CM | POA: Diagnosis not present

## 2014-10-13 ENCOUNTER — Other Ambulatory Visit: Payer: Self-pay | Admitting: Family Medicine

## 2014-10-14 NOTE — Telephone Encounter (Signed)
She may have this +2 refills will need follow-up in the early spring.

## 2014-10-21 DIAGNOSIS — Z79899 Other long term (current) drug therapy: Secondary | ICD-10-CM | POA: Diagnosis not present

## 2014-10-21 DIAGNOSIS — M545 Low back pain: Secondary | ICD-10-CM | POA: Diagnosis not present

## 2014-10-21 DIAGNOSIS — M961 Postlaminectomy syndrome, not elsewhere classified: Secondary | ICD-10-CM | POA: Diagnosis not present

## 2014-11-11 DIAGNOSIS — F419 Anxiety disorder, unspecified: Secondary | ICD-10-CM | POA: Diagnosis not present

## 2014-11-28 DIAGNOSIS — M961 Postlaminectomy syndrome, not elsewhere classified: Secondary | ICD-10-CM | POA: Diagnosis not present

## 2014-11-28 DIAGNOSIS — F419 Anxiety disorder, unspecified: Secondary | ICD-10-CM | POA: Diagnosis not present

## 2014-11-28 DIAGNOSIS — M545 Low back pain: Secondary | ICD-10-CM | POA: Diagnosis not present

## 2014-11-28 DIAGNOSIS — Z79899 Other long term (current) drug therapy: Secondary | ICD-10-CM | POA: Diagnosis not present

## 2014-12-24 ENCOUNTER — Other Ambulatory Visit: Payer: Self-pay | Admitting: Family Medicine

## 2014-12-25 NOTE — Telephone Encounter (Signed)
The last time her B12 was checked was in 2014

## 2014-12-26 ENCOUNTER — Other Ambulatory Visit: Payer: Self-pay | Admitting: *Deleted

## 2014-12-26 DIAGNOSIS — Z79899 Other long term (current) drug therapy: Secondary | ICD-10-CM | POA: Diagnosis not present

## 2014-12-26 DIAGNOSIS — M5416 Radiculopathy, lumbar region: Secondary | ICD-10-CM | POA: Diagnosis not present

## 2014-12-26 MED ORDER — CYANOCOBALAMIN 1000 MCG/ML IJ SOLN: INTRAMUSCULAR | Status: AC

## 2014-12-26 MED ORDER — HYDROXYZINE HCL 25 MG PO TABS: 25.0000 mg | ORAL_TABLET | Freq: Three times a day (TID) | ORAL | Status: AC | PRN

## 2015-01-13 ENCOUNTER — Telehealth: Payer: Self-pay

## 2015-01-14 ENCOUNTER — Ambulatory Visit: Payer: Medicare Other | Admitting: Family Medicine

## 2015-01-22 ENCOUNTER — Other Ambulatory Visit: Payer: Self-pay | Admitting: Family Medicine

## 2015-01-28 ENCOUNTER — Other Ambulatory Visit: Payer: Self-pay | Admitting: Family Medicine

## 2015-01-28 NOTE — Telephone Encounter (Signed)
I believe this was just handled?

## 2015-01-28 NOTE — Telephone Encounter (Signed)
Patient may have one refill, I recommend for this patient follow-up for discussion regarding bedtime medication. It is now not recommended for ongoing use of Xanax for sleep at nighttime. There are other medications that can be used.

## 2015-01-29 DIAGNOSIS — M5416 Radiculopathy, lumbar region: Secondary | ICD-10-CM | POA: Diagnosis not present

## 2015-01-29 DIAGNOSIS — M4310 Spondylolisthesis, site unspecified: Secondary | ICD-10-CM | POA: Diagnosis not present

## 2015-01-29 DIAGNOSIS — M961 Postlaminectomy syndrome, not elsewhere classified: Secondary | ICD-10-CM | POA: Diagnosis not present

## 2015-01-29 DIAGNOSIS — Z79891 Long term (current) use of opiate analgesic: Secondary | ICD-10-CM | POA: Diagnosis not present

## 2015-01-29 DIAGNOSIS — G8929 Other chronic pain: Secondary | ICD-10-CM | POA: Diagnosis not present

## 2015-01-29 DIAGNOSIS — Z79899 Other long term (current) drug therapy: Secondary | ICD-10-CM | POA: Diagnosis not present

## 2015-03-03 DIAGNOSIS — Z79899 Other long term (current) drug therapy: Secondary | ICD-10-CM | POA: Diagnosis not present

## 2015-03-03 DIAGNOSIS — M5416 Radiculopathy, lumbar region: Secondary | ICD-10-CM | POA: Diagnosis not present

## 2015-03-31 DIAGNOSIS — Z79891 Long term (current) use of opiate analgesic: Secondary | ICD-10-CM | POA: Diagnosis not present

## 2015-03-31 DIAGNOSIS — Z79899 Other long term (current) drug therapy: Secondary | ICD-10-CM | POA: Diagnosis not present

## 2015-03-31 DIAGNOSIS — M5416 Radiculopathy, lumbar region: Secondary | ICD-10-CM | POA: Diagnosis not present

## 2015-05-06 DIAGNOSIS — G8929 Other chronic pain: Secondary | ICD-10-CM | POA: Diagnosis not present

## 2015-05-06 DIAGNOSIS — Z91040 Latex allergy status: Secondary | ICD-10-CM | POA: Diagnosis not present

## 2015-05-06 DIAGNOSIS — M545 Low back pain: Secondary | ICD-10-CM | POA: Diagnosis not present

## 2015-05-07 DIAGNOSIS — G8929 Other chronic pain: Secondary | ICD-10-CM | POA: Diagnosis not present

## 2015-05-07 DIAGNOSIS — Z91040 Latex allergy status: Secondary | ICD-10-CM | POA: Diagnosis not present

## 2015-05-07 DIAGNOSIS — M545 Low back pain: Secondary | ICD-10-CM | POA: Diagnosis not present

## 2015-05-27 DIAGNOSIS — Z5181 Encounter for therapeutic drug level monitoring: Secondary | ICD-10-CM | POA: Diagnosis not present

## 2015-05-27 DIAGNOSIS — R51 Headache: Secondary | ICD-10-CM | POA: Diagnosis not present

## 2015-05-27 DIAGNOSIS — M961 Postlaminectomy syndrome, not elsewhere classified: Secondary | ICD-10-CM | POA: Diagnosis not present

## 2015-05-27 DIAGNOSIS — G8929 Other chronic pain: Secondary | ICD-10-CM | POA: Diagnosis not present

## 2015-05-27 DIAGNOSIS — M5416 Radiculopathy, lumbar region: Secondary | ICD-10-CM | POA: Diagnosis not present

## 2015-05-27 DIAGNOSIS — G43909 Migraine, unspecified, not intractable, without status migrainosus: Secondary | ICD-10-CM | POA: Diagnosis not present

## 2015-05-27 DIAGNOSIS — M25559 Pain in unspecified hip: Secondary | ICD-10-CM | POA: Diagnosis not present

## 2015-05-27 DIAGNOSIS — Z79891 Long term (current) use of opiate analgesic: Secondary | ICD-10-CM | POA: Diagnosis not present

## 2015-05-27 DIAGNOSIS — Z79899 Other long term (current) drug therapy: Secondary | ICD-10-CM | POA: Diagnosis not present

## 2015-07-23 DIAGNOSIS — M4692 Unspecified inflammatory spondylopathy, cervical region: Secondary | ICD-10-CM | POA: Diagnosis not present

## 2015-07-23 DIAGNOSIS — M5431 Sciatica, right side: Secondary | ICD-10-CM | POA: Diagnosis not present

## 2015-07-23 DIAGNOSIS — M5030 Other cervical disc degeneration, unspecified cervical region: Secondary | ICD-10-CM | POA: Diagnosis not present

## 2015-07-23 DIAGNOSIS — M129 Arthropathy, unspecified: Secondary | ICD-10-CM | POA: Diagnosis not present

## 2015-07-23 DIAGNOSIS — M961 Postlaminectomy syndrome, not elsewhere classified: Secondary | ICD-10-CM | POA: Diagnosis not present

## 2015-07-23 DIAGNOSIS — M50321 Other cervical disc degeneration at C4-C5 level: Secondary | ICD-10-CM | POA: Diagnosis not present

## 2015-07-23 DIAGNOSIS — R51 Headache: Secondary | ICD-10-CM | POA: Diagnosis not present

## 2015-07-23 DIAGNOSIS — M542 Cervicalgia: Secondary | ICD-10-CM | POA: Diagnosis not present

## 2015-07-23 DIAGNOSIS — M5441 Lumbago with sciatica, right side: Secondary | ICD-10-CM | POA: Diagnosis not present

## 2015-07-23 DIAGNOSIS — Z79891 Long term (current) use of opiate analgesic: Secondary | ICD-10-CM | POA: Diagnosis not present

## 2015-07-23 DIAGNOSIS — Z79899 Other long term (current) drug therapy: Secondary | ICD-10-CM | POA: Diagnosis not present

## 2015-10-18 DIAGNOSIS — J069 Acute upper respiratory infection, unspecified: Secondary | ICD-10-CM | POA: Diagnosis not present

## 2015-10-18 DIAGNOSIS — J029 Acute pharyngitis, unspecified: Secondary | ICD-10-CM | POA: Diagnosis not present

## 2015-10-18 DIAGNOSIS — J019 Acute sinusitis, unspecified: Secondary | ICD-10-CM | POA: Diagnosis not present

## 2015-10-18 DIAGNOSIS — H109 Unspecified conjunctivitis: Secondary | ICD-10-CM | POA: Diagnosis not present

## 2015-10-21 DIAGNOSIS — M5431 Sciatica, right side: Secondary | ICD-10-CM | POA: Diagnosis not present

## 2015-10-21 DIAGNOSIS — M961 Postlaminectomy syndrome, not elsewhere classified: Secondary | ICD-10-CM | POA: Diagnosis not present

## 2015-10-21 DIAGNOSIS — M542 Cervicalgia: Secondary | ICD-10-CM | POA: Diagnosis not present

## 2015-10-21 DIAGNOSIS — Z79891 Long term (current) use of opiate analgesic: Secondary | ICD-10-CM | POA: Diagnosis not present

## 2016-01-05 ENCOUNTER — Other Ambulatory Visit: Payer: Self-pay | Admitting: Family Medicine

## 2016-01-13 DIAGNOSIS — L309 Dermatitis, unspecified: Secondary | ICD-10-CM | POA: Diagnosis not present

## 2016-01-13 DIAGNOSIS — Z79891 Long term (current) use of opiate analgesic: Secondary | ICD-10-CM | POA: Diagnosis not present

## 2016-01-13 DIAGNOSIS — M961 Postlaminectomy syndrome, not elsewhere classified: Secondary | ICD-10-CM | POA: Diagnosis not present

## 2016-01-13 DIAGNOSIS — M542 Cervicalgia: Secondary | ICD-10-CM | POA: Diagnosis not present

## 2016-01-13 DIAGNOSIS — M5431 Sciatica, right side: Secondary | ICD-10-CM | POA: Diagnosis not present

## 2016-01-13 DIAGNOSIS — M4806 Spinal stenosis, lumbar region: Secondary | ICD-10-CM | POA: Diagnosis not present

## 2016-01-14 ENCOUNTER — Other Ambulatory Visit: Payer: Self-pay | Admitting: Family Medicine

## 2016-03-30 DIAGNOSIS — M961 Postlaminectomy syndrome, not elsewhere classified: Secondary | ICD-10-CM | POA: Diagnosis not present

## 2016-03-30 DIAGNOSIS — M4806 Spinal stenosis, lumbar region: Secondary | ICD-10-CM | POA: Diagnosis not present

## 2016-03-30 DIAGNOSIS — M5441 Lumbago with sciatica, right side: Secondary | ICD-10-CM | POA: Diagnosis not present

## 2016-03-30 DIAGNOSIS — Z79891 Long term (current) use of opiate analgesic: Secondary | ICD-10-CM | POA: Diagnosis not present

## 2016-03-30 DIAGNOSIS — M5431 Sciatica, right side: Secondary | ICD-10-CM | POA: Diagnosis not present

## 2016-03-30 DIAGNOSIS — K219 Gastro-esophageal reflux disease without esophagitis: Secondary | ICD-10-CM | POA: Diagnosis not present

## 2016-03-30 DIAGNOSIS — M542 Cervicalgia: Secondary | ICD-10-CM | POA: Diagnosis not present

## 2016-03-30 DIAGNOSIS — G8929 Other chronic pain: Secondary | ICD-10-CM | POA: Diagnosis not present

## 2016-07-13 DIAGNOSIS — G8929 Other chronic pain: Secondary | ICD-10-CM | POA: Diagnosis not present

## 2016-07-13 DIAGNOSIS — M542 Cervicalgia: Secondary | ICD-10-CM | POA: Diagnosis not present

## 2016-07-13 DIAGNOSIS — K219 Gastro-esophageal reflux disease without esophagitis: Secondary | ICD-10-CM | POA: Diagnosis not present

## 2016-07-13 DIAGNOSIS — M5441 Lumbago with sciatica, right side: Secondary | ICD-10-CM | POA: Diagnosis not present

## 2016-07-13 DIAGNOSIS — M5431 Sciatica, right side: Secondary | ICD-10-CM | POA: Diagnosis not present

## 2016-07-13 DIAGNOSIS — Z79891 Long term (current) use of opiate analgesic: Secondary | ICD-10-CM | POA: Diagnosis not present

## 2016-07-13 DIAGNOSIS — Z79899 Other long term (current) drug therapy: Secondary | ICD-10-CM | POA: Diagnosis not present

## 2016-07-13 DIAGNOSIS — M48061 Spinal stenosis, lumbar region without neurogenic claudication: Secondary | ICD-10-CM | POA: Diagnosis not present

## 2016-07-13 DIAGNOSIS — F419 Anxiety disorder, unspecified: Secondary | ICD-10-CM | POA: Diagnosis not present

## 2016-07-13 DIAGNOSIS — M961 Postlaminectomy syndrome, not elsewhere classified: Secondary | ICD-10-CM | POA: Diagnosis not present

## 2016-07-21 DIAGNOSIS — Z23 Encounter for immunization: Secondary | ICD-10-CM | POA: Diagnosis not present

## 2016-10-04 DIAGNOSIS — F17200 Nicotine dependence, unspecified, uncomplicated: Secondary | ICD-10-CM | POA: Diagnosis not present

## 2016-10-04 DIAGNOSIS — M545 Low back pain: Secondary | ICD-10-CM | POA: Diagnosis not present

## 2016-10-04 DIAGNOSIS — T1490XA Injury, unspecified, initial encounter: Secondary | ICD-10-CM | POA: Diagnosis not present

## 2016-10-04 DIAGNOSIS — S7002XA Contusion of left hip, initial encounter: Secondary | ICD-10-CM | POA: Diagnosis not present

## 2016-10-04 DIAGNOSIS — M25552 Pain in left hip: Secondary | ICD-10-CM | POA: Diagnosis not present

## 2016-10-04 DIAGNOSIS — G8911 Acute pain due to trauma: Secondary | ICD-10-CM | POA: Diagnosis not present

## 2016-10-11 DIAGNOSIS — M5431 Sciatica, right side: Secondary | ICD-10-CM | POA: Diagnosis not present

## 2016-10-11 DIAGNOSIS — M961 Postlaminectomy syndrome, not elsewhere classified: Secondary | ICD-10-CM | POA: Diagnosis not present

## 2016-10-11 DIAGNOSIS — N3941 Urge incontinence: Secondary | ICD-10-CM | POA: Diagnosis not present

## 2016-10-11 DIAGNOSIS — Z79891 Long term (current) use of opiate analgesic: Secondary | ICD-10-CM | POA: Diagnosis not present

## 2016-10-11 DIAGNOSIS — M542 Cervicalgia: Secondary | ICD-10-CM | POA: Diagnosis not present

## 2017-01-04 DIAGNOSIS — M5431 Sciatica, right side: Secondary | ICD-10-CM | POA: Diagnosis not present

## 2017-01-04 DIAGNOSIS — M961 Postlaminectomy syndrome, not elsewhere classified: Secondary | ICD-10-CM | POA: Diagnosis not present

## 2017-01-04 DIAGNOSIS — Z888 Allergy status to other drugs, medicaments and biological substances status: Secondary | ICD-10-CM | POA: Diagnosis not present

## 2017-01-04 DIAGNOSIS — R278 Other lack of coordination: Secondary | ICD-10-CM | POA: Diagnosis not present

## 2017-01-04 DIAGNOSIS — R631 Polydipsia: Secondary | ICD-10-CM | POA: Diagnosis not present

## 2017-01-04 DIAGNOSIS — F419 Anxiety disorder, unspecified: Secondary | ICD-10-CM | POA: Diagnosis not present

## 2017-01-04 DIAGNOSIS — Z91040 Latex allergy status: Secondary | ICD-10-CM | POA: Diagnosis not present

## 2017-01-04 DIAGNOSIS — R6882 Decreased libido: Secondary | ICD-10-CM | POA: Diagnosis not present

## 2017-01-04 DIAGNOSIS — Z881 Allergy status to other antibiotic agents status: Secondary | ICD-10-CM | POA: Diagnosis not present

## 2017-01-04 DIAGNOSIS — F5231 Female orgasmic disorder: Secondary | ICD-10-CM | POA: Diagnosis not present

## 2017-01-04 DIAGNOSIS — Z79891 Long term (current) use of opiate analgesic: Secondary | ICD-10-CM | POA: Diagnosis not present

## 2017-01-04 DIAGNOSIS — G4700 Insomnia, unspecified: Secondary | ICD-10-CM | POA: Diagnosis not present

## 2017-01-04 DIAGNOSIS — R51 Headache: Secondary | ICD-10-CM | POA: Diagnosis not present

## 2017-01-04 DIAGNOSIS — N939 Abnormal uterine and vaginal bleeding, unspecified: Secondary | ICD-10-CM | POA: Diagnosis not present

## 2017-01-04 DIAGNOSIS — M48061 Spinal stenosis, lumbar region without neurogenic claudication: Secondary | ICD-10-CM | POA: Diagnosis not present

## 2017-01-04 DIAGNOSIS — M542 Cervicalgia: Secondary | ICD-10-CM | POA: Diagnosis not present

## 2017-01-04 DIAGNOSIS — F3181 Bipolar II disorder: Secondary | ICD-10-CM | POA: Diagnosis not present

## 2017-01-04 DIAGNOSIS — Z79899 Other long term (current) drug therapy: Secondary | ICD-10-CM | POA: Diagnosis not present

## 2017-01-04 DIAGNOSIS — K219 Gastro-esophageal reflux disease without esophagitis: Secondary | ICD-10-CM | POA: Diagnosis not present

## 2017-01-04 DIAGNOSIS — R350 Frequency of micturition: Secondary | ICD-10-CM | POA: Diagnosis not present

## 2017-01-04 DIAGNOSIS — Z7951 Long term (current) use of inhaled steroids: Secondary | ICD-10-CM | POA: Diagnosis not present

## 2017-01-04 DIAGNOSIS — R5383 Other fatigue: Secondary | ICD-10-CM | POA: Diagnosis not present

## 2017-01-04 DIAGNOSIS — Z791 Long term (current) use of non-steroidal anti-inflammatories (NSAID): Secondary | ICD-10-CM | POA: Diagnosis not present

## 2017-01-21 DIAGNOSIS — N926 Irregular menstruation, unspecified: Secondary | ICD-10-CM | POA: Diagnosis not present

## 2017-01-21 DIAGNOSIS — L900 Lichen sclerosus et atrophicus: Secondary | ICD-10-CM | POA: Diagnosis not present

## 2017-01-21 DIAGNOSIS — N3946 Mixed incontinence: Secondary | ICD-10-CM | POA: Diagnosis not present

## 2017-02-16 DIAGNOSIS — N858 Other specified noninflammatory disorders of uterus: Secondary | ICD-10-CM | POA: Diagnosis not present

## 2017-02-16 DIAGNOSIS — N926 Irregular menstruation, unspecified: Secondary | ICD-10-CM | POA: Diagnosis not present

## 2017-02-16 DIAGNOSIS — N939 Abnormal uterine and vaginal bleeding, unspecified: Secondary | ICD-10-CM | POA: Diagnosis not present

## 2017-02-16 DIAGNOSIS — N838 Other noninflammatory disorders of ovary, fallopian tube and broad ligament: Secondary | ICD-10-CM | POA: Diagnosis not present

## 2017-03-15 ENCOUNTER — Ambulatory Visit
Admission: RE | Admit: 2017-03-15 | Discharge: 2017-03-15 | Disposition: A | Discharge status: Home / Self Care | Payer: MEDICARE | Attending: Anesthesiology | Admitting: Anesthesiology

## 2017-03-15 DIAGNOSIS — M961 Postlaminectomy syndrome, not elsewhere classified: Secondary | ICD-10-CM | POA: Diagnosis not present

## 2017-03-15 DIAGNOSIS — M5431 Sciatica, right side: Secondary | ICD-10-CM | POA: Diagnosis not present

## 2017-03-15 DIAGNOSIS — M542 Cervicalgia: Secondary | ICD-10-CM | POA: Diagnosis not present

## 2017-03-15 DIAGNOSIS — M5441 Lumbago with sciatica, right side: Secondary | ICD-10-CM | POA: Diagnosis not present

## 2017-03-15 DIAGNOSIS — R32 Unspecified urinary incontinence: Secondary | ICD-10-CM | POA: Diagnosis not present

## 2017-03-15 DIAGNOSIS — G8929 Other chronic pain: Secondary | ICD-10-CM | POA: Diagnosis not present

## 2017-03-15 DIAGNOSIS — Z79891 Long term (current) use of opiate analgesic: Secondary | ICD-10-CM | POA: Diagnosis not present

## 2017-03-15 MED ORDER — OXYCODONE ER 60 MG TABLET,CRUSH RESISTANT,EXTENDED RELEASE 12 HR
ORAL_TABLET | Freq: Three times a day (TID) | ORAL | 0 refills | 0 days | Status: CP
Start: 2017-03-15 — End: 2017-06-13

## 2017-03-15 MED ORDER — NALOXONE 4 MG/ACTUATION NASAL SPRAY
Freq: Once | NASAL | 0 refills | 0 days | Status: CP | PRN
Start: 2017-03-15 — End: ?

## 2017-03-15 MED ORDER — OXYCODONE 10 MG TABLET
ORAL_TABLET | Freq: Four times a day (QID) | ORAL | 0 refills | 0 days | Status: CP | PRN
Start: 2017-03-15 — End: 2017-06-13

## 2017-03-15 MED ORDER — CYCLOBENZAPRINE 10 MG TABLET
ORAL_TABLET | Freq: Three times a day (TID) | ORAL | 2 refills | 0 days | Status: CP | PRN
Start: 2017-03-15 — End: 2017-06-13

## 2017-03-30 DIAGNOSIS — K0889 Other specified disorders of teeth and supporting structures: Secondary | ICD-10-CM | POA: Diagnosis not present

## 2017-03-30 DIAGNOSIS — F17200 Nicotine dependence, unspecified, uncomplicated: Secondary | ICD-10-CM | POA: Diagnosis not present

## 2017-06-13 ENCOUNTER — Ambulatory Visit: Admission: RE | Admit: 2017-06-13 | Discharge: 2017-06-13 | Disposition: A | Discharge status: Home / Self Care

## 2017-06-13 DIAGNOSIS — M961 Postlaminectomy syndrome, not elsewhere classified: Secondary | ICD-10-CM | POA: Diagnosis not present

## 2017-06-13 DIAGNOSIS — M542 Cervicalgia: Secondary | ICD-10-CM | POA: Diagnosis not present

## 2017-06-13 DIAGNOSIS — Z79891 Long term (current) use of opiate analgesic: Secondary | ICD-10-CM | POA: Diagnosis not present

## 2017-06-13 DIAGNOSIS — L309 Dermatitis, unspecified: Secondary | ICD-10-CM | POA: Diagnosis not present

## 2017-06-13 DIAGNOSIS — M5431 Sciatica, right side: Secondary | ICD-10-CM | POA: Diagnosis not present

## 2017-06-13 MED ORDER — OXYCODONE ER 60 MG TABLET,CRUSH RESISTANT,EXTENDED RELEASE 12 HR: 60 mg | tablet | Freq: Three times a day (TID) | 0 refills | 0 days | Status: AC

## 2017-06-13 MED ORDER — CLOBETASOL 0.05 % TOPICAL OINTMENT
Freq: Two times a day (BID) | TOPICAL | 2 refills | 0 days | Status: CP
Start: 2017-06-13 — End: ?

## 2017-06-13 MED ORDER — CYCLOBENZAPRINE 10 MG TABLET
ORAL_TABLET | Freq: Three times a day (TID) | ORAL | 2 refills | 0.00000 days | Status: CP | PRN
Start: 2017-06-13 — End: 2018-11-24

## 2017-06-13 MED ORDER — OXYCODONE ER 60 MG TABLET,CRUSH RESISTANT,EXTENDED RELEASE 12 HR
ORAL_TABLET | Freq: Three times a day (TID) | ORAL | 0 refills | 0.00000 days | Status: CP
Start: 2017-06-13 — End: 2017-06-13

## 2017-06-13 MED ORDER — OXYCODONE 10 MG TABLET
ORAL_TABLET | Freq: Four times a day (QID) | ORAL | 0 refills | 0.00000 days | Status: CP | PRN
Start: 2017-06-13 — End: 2017-08-30

## 2017-06-13 MED ORDER — OXYCODONE 10 MG TABLET: 10 mg | tablet | Freq: Four times a day (QID) | 0 refills | 0 days | Status: AC

## 2017-06-16 MED ORDER — HYDROXYZINE HCL 25 MG TABLET
ORAL_TABLET | Freq: Two times a day (BID) | ORAL | 0 refills | 0.00000 days | Status: CP | PRN
Start: 2017-06-16 — End: ?

## 2017-07-04 DIAGNOSIS — Z23 Encounter for immunization: Secondary | ICD-10-CM | POA: Diagnosis not present

## 2017-07-24 DIAGNOSIS — K047 Periapical abscess without sinus: Secondary | ICD-10-CM | POA: Diagnosis not present

## 2017-08-30 ENCOUNTER — Ambulatory Visit: Admission: RE | Admit: 2017-08-30 | Discharge: 2017-08-30 | Disposition: A | Discharge status: Home / Self Care

## 2017-08-30 DIAGNOSIS — Z79891 Long term (current) use of opiate analgesic: Secondary | ICD-10-CM | POA: Diagnosis not present

## 2017-08-30 DIAGNOSIS — M542 Cervicalgia: Secondary | ICD-10-CM | POA: Diagnosis not present

## 2017-08-30 DIAGNOSIS — M961 Postlaminectomy syndrome, not elsewhere classified: Secondary | ICD-10-CM | POA: Diagnosis not present

## 2017-08-30 DIAGNOSIS — M5431 Sciatica, right side: Secondary | ICD-10-CM | POA: Diagnosis not present

## 2017-08-30 MED ORDER — OXYCODONE 10 MG TABLET
ORAL_TABLET | Freq: Four times a day (QID) | ORAL | 0 refills | 0 days | Status: CP | PRN
Start: 2017-08-30 — End: 2017-12-02

## 2017-08-30 MED ORDER — OXYCODONE ER 60 MG TABLET,CRUSH RESISTANT,EXTENDED RELEASE 12 HR
ORAL_TABLET | Freq: Three times a day (TID) | ORAL | 0 refills | 0.00000 days | Status: CP
Start: 2017-08-30 — End: 2017-12-02

## 2017-12-02 MED ORDER — OXYCODONE 10 MG TABLET
ORAL_TABLET | Freq: Four times a day (QID) | ORAL | 0 refills | 0.00000 days | Status: CP | PRN
Start: 2017-12-02 — End: 2018-01-10

## 2017-12-02 MED ORDER — OXYCODONE ER 60 MG TABLET,CRUSH RESISTANT,EXTENDED RELEASE 12 HR
ORAL_TABLET | Freq: Three times a day (TID) | ORAL | 0 refills | 0.00000 days | Status: CP
Start: 2017-12-02 — End: 2018-01-10

## 2018-01-10 ENCOUNTER — Encounter: Admit: 2018-01-10 | Discharge: 2018-01-11 | Payer: MEDICARE

## 2018-01-10 DIAGNOSIS — M961 Postlaminectomy syndrome, not elsewhere classified: Secondary | ICD-10-CM

## 2018-01-10 DIAGNOSIS — Z79891 Long term (current) use of opiate analgesic: Principal | ICD-10-CM

## 2018-01-10 DIAGNOSIS — M5431 Sciatica, right side: Secondary | ICD-10-CM

## 2018-01-10 DIAGNOSIS — M542 Cervicalgia: Secondary | ICD-10-CM

## 2018-01-10 MED ORDER — OXYCODONE 10 MG TABLET
ORAL_TABLET | Freq: Four times a day (QID) | ORAL | 0 refills | 0 days | Status: CP | PRN
Start: 2018-01-10 — End: 2018-04-03

## 2018-01-10 MED ORDER — OXYCODONE ER 60 MG TABLET,CRUSH RESISTANT,EXTENDED RELEASE 12 HR
ORAL_TABLET | Freq: Three times a day (TID) | ORAL | 0 refills | 0.00000 days | Status: CP
Start: 2018-01-10 — End: 2018-04-03

## 2018-04-03 ENCOUNTER — Encounter: Admit: 2018-04-03 | Discharge: 2018-04-04 | Payer: MEDICARE

## 2018-04-03 DIAGNOSIS — Z79891 Long term (current) use of opiate analgesic: Principal | ICD-10-CM

## 2018-04-03 DIAGNOSIS — M542 Cervicalgia: Secondary | ICD-10-CM

## 2018-04-03 DIAGNOSIS — M961 Postlaminectomy syndrome, not elsewhere classified: Secondary | ICD-10-CM

## 2018-04-03 DIAGNOSIS — M5431 Sciatica, right side: Secondary | ICD-10-CM

## 2018-04-03 DIAGNOSIS — Z0289 Encounter for other administrative examinations: Secondary | ICD-10-CM

## 2018-04-03 MED ORDER — OXYCODONE 10 MG TABLET
ORAL_TABLET | Freq: Four times a day (QID) | ORAL | 0 refills | 0 days | Status: CP | PRN
Start: 2018-04-03 — End: 2018-06-07

## 2018-04-03 MED ORDER — OXYCODONE ER 60 MG TABLET,CRUSH RESISTANT,EXTENDED RELEASE 12 HR
ORAL_TABLET | Freq: Three times a day (TID) | ORAL | 0 refills | 0.00000 days | Status: CP
Start: 2018-04-03 — End: 2018-06-07

## 2018-06-07 MED ORDER — OXYCODONE 10 MG TABLET
ORAL_TABLET | Freq: Four times a day (QID) | ORAL | 0 refills | 0.00000 days | Status: CP | PRN
Start: 2018-06-07 — End: 2018-06-09

## 2018-06-07 MED ORDER — OXYCODONE ER 60 MG TABLET,CRUSH RESISTANT,EXTENDED RELEASE 12 HR
ORAL_TABLET | Freq: Three times a day (TID) | ORAL | 0 refills | 0 days | Status: CP
Start: 2018-06-07 — End: 2018-06-09

## 2018-06-09 MED ORDER — OXYCODONE 10 MG TABLET
ORAL_TABLET | Freq: Four times a day (QID) | ORAL | 0 refills | 0 days | Status: CP | PRN
Start: 2018-06-09 — End: 2018-06-19

## 2018-06-09 MED ORDER — OXYCODONE ER 60 MG TABLET,CRUSH RESISTANT,EXTENDED RELEASE 12 HR
ORAL_TABLET | Freq: Three times a day (TID) | ORAL | 0 refills | 0 days | Status: CP
Start: 2018-06-09 — End: 2018-06-19

## 2018-06-19 ENCOUNTER — Ambulatory Visit: Admit: 2018-06-19 | Discharge: 2018-06-20 | Payer: MEDICARE

## 2018-06-19 DIAGNOSIS — Z79891 Long term (current) use of opiate analgesic: Principal | ICD-10-CM

## 2018-06-19 DIAGNOSIS — M5431 Sciatica, right side: Secondary | ICD-10-CM

## 2018-06-19 DIAGNOSIS — M542 Cervicalgia: Secondary | ICD-10-CM

## 2018-06-19 DIAGNOSIS — M961 Postlaminectomy syndrome, not elsewhere classified: Secondary | ICD-10-CM

## 2018-06-19 MED ORDER — OXYCODONE ER 60 MG TABLET,CRUSH RESISTANT,EXTENDED RELEASE 12 HR
ORAL_TABLET | Freq: Three times a day (TID) | ORAL | 0 refills | 0 days | Status: CP
Start: 2018-06-19 — End: 2019-01-03

## 2018-06-19 MED ORDER — OXYCODONE 10 MG TABLET
ORAL_TABLET | Freq: Four times a day (QID) | ORAL | 0 refills | 0 days | Status: CP | PRN
Start: 2018-06-19 — End: 2018-10-06

## 2018-06-19 MED ORDER — LUBIPROSTONE 24 MCG CAPSULE
ORAL_CAPSULE | Freq: Every day | ORAL | 0 refills | 0.00000 days | Status: CP | PRN
Start: 2018-06-19 — End: 2018-11-24

## 2018-10-06 ENCOUNTER — Encounter: Admit: 2018-10-06 | Discharge: 2018-10-07 | Payer: MEDICARE

## 2018-10-06 DIAGNOSIS — M542 Cervicalgia: Secondary | ICD-10-CM

## 2018-10-06 DIAGNOSIS — M5431 Sciatica, right side: Secondary | ICD-10-CM

## 2018-10-06 DIAGNOSIS — Z79891 Long term (current) use of opiate analgesic: Principal | ICD-10-CM

## 2018-10-06 DIAGNOSIS — M961 Postlaminectomy syndrome, not elsewhere classified: Secondary | ICD-10-CM

## 2018-10-06 MED ORDER — OXYCODONE 10 MG TABLET
ORAL_TABLET | Freq: Four times a day (QID) | ORAL | 0 refills | 0.00000 days | Status: CP | PRN
Start: 2018-10-06 — End: 2018-10-17
  Filled 2018-10-06: qty 120, 30d supply, fill #0

## 2018-10-06 MED ORDER — OXYCODONE ER 60 MG TABLET,CRUSH RESISTANT,EXTENDED RELEASE 12 HR
ORAL_TABLET | Freq: Three times a day (TID) | ORAL | 0 refills | 0.00000 days | Status: CP
Start: 2018-10-06 — End: 2018-10-18

## 2018-10-06 MED ORDER — OXYCODONE 10 MG TABLET: 10 mg | tablet | Freq: Four times a day (QID) | 0 refills | 0 days | Status: AC

## 2018-10-06 MED ORDER — OXYCODONE ER 60 MG TABLET,CRUSH RESISTANT,EXTENDED RELEASE 12 HR: 60 mg | tablet | Freq: Three times a day (TID) | 0 refills | 0 days | Status: AC

## 2018-10-06 MED FILL — OXYCONTIN 60 MG TABLET,CRUSH RESISTANT,EXTENDED RELEASE: ORAL | 30 days supply | Qty: 90 | Fill #0

## 2018-10-06 MED FILL — OXYCONTIN 60 MG TABLET,CRUSH RESISTANT,EXTENDED RELEASE: 30 days supply | Qty: 90 | Fill #0 | Status: AC

## 2018-10-06 MED FILL — OXYCODONE 10 MG TABLET: 30 days supply | Qty: 120 | Fill #0 | Status: AC

## 2018-10-18 MED ORDER — OXYCODONE ER 60 MG TABLET,CRUSH RESISTANT,EXTENDED RELEASE 12 HR: 60 mg | tablet | Freq: Three times a day (TID) | 0 refills | 0 days | Status: AC

## 2018-10-18 MED ORDER — OXYCODONE 10 MG TABLET: 10 mg | tablet | Freq: Four times a day (QID) | 0 refills | 0 days | Status: AC

## 2018-10-23 MED ORDER — OXYCODONE 10 MG TABLET: 10 mg | tablet | Freq: Four times a day (QID) | 0 refills | 0 days | Status: AC

## 2018-10-23 MED ORDER — OXYCODONE ER 60 MG TABLET,CRUSH RESISTANT,EXTENDED RELEASE 12 HR: 60 mg | tablet | Freq: Three times a day (TID) | 0 refills | 0 days | Status: AC

## 2018-11-06 MED ORDER — OXYCODONE 10 MG TABLET
ORAL_TABLET | Freq: Four times a day (QID) | ORAL | 0 refills | 0.00000 days | Status: CP | PRN
Start: 2018-11-06 — End: 2018-10-06

## 2018-11-06 MED ORDER — OXYCODONE ER 60 MG TABLET,CRUSH RESISTANT,EXTENDED RELEASE 12 HR
ORAL_TABLET | Freq: Three times a day (TID) | ORAL | 0 refills | 0.00000 days | Status: CP
Start: 2018-11-06 — End: 2018-10-23

## 2018-11-23 DIAGNOSIS — M542 Cervicalgia: Principal | ICD-10-CM

## 2018-11-23 DIAGNOSIS — Z79891 Long term (current) use of opiate analgesic: Principal | ICD-10-CM

## 2018-11-23 DIAGNOSIS — M5431 Sciatica, right side: Principal | ICD-10-CM

## 2018-11-23 DIAGNOSIS — M961 Postlaminectomy syndrome, not elsewhere classified: Principal | ICD-10-CM

## 2018-11-24 MED ORDER — LUBIPROSTONE 24 MCG CAPSULE
ORAL_CAPSULE | Freq: Every day | ORAL | 0 refills | 0.00000 days | Status: CP | PRN
Start: 2018-11-24 — End: ?

## 2018-11-24 MED ORDER — CYCLOBENZAPRINE 10 MG TABLET
ORAL_TABLET | Freq: Three times a day (TID) | ORAL | 2 refills | 0 days | Status: CP | PRN
Start: 2018-11-24 — End: 2019-01-03

## 2018-12-06 MED ORDER — OXYCODONE ER 60 MG TABLET,CRUSH RESISTANT,EXTENDED RELEASE 12 HR
ORAL_TABLET | Freq: Three times a day (TID) | ORAL | 0 refills | 0.00000 days | Status: CP
Start: 2018-12-06 — End: 2018-10-06

## 2018-12-06 MED ORDER — OXYCODONE 10 MG TABLET
ORAL_TABLET | Freq: Four times a day (QID) | ORAL | 0 refills | 0.00000 days | Status: CP | PRN
Start: 2018-12-06 — End: 2018-10-06

## 2019-01-03 ENCOUNTER — Encounter: Admit: 2019-01-03 | Discharge: 2019-01-04 | Payer: MEDICARE

## 2019-01-03 DIAGNOSIS — M542 Cervicalgia: Secondary | ICD-10-CM

## 2019-01-03 DIAGNOSIS — M5431 Sciatica, right side: Secondary | ICD-10-CM

## 2019-01-03 DIAGNOSIS — M961 Postlaminectomy syndrome, not elsewhere classified: Secondary | ICD-10-CM

## 2019-01-03 DIAGNOSIS — Z79891 Long term (current) use of opiate analgesic: Principal | ICD-10-CM

## 2019-01-03 MED ORDER — OXYCODONE ER 60 MG TABLET,CRUSH RESISTANT,EXTENDED RELEASE 12 HR: 60 mg | tablet | Freq: Three times a day (TID) | 0 refills | 0 days | Status: AC

## 2019-01-03 MED ORDER — CYCLOBENZAPRINE 10 MG TABLET
ORAL_TABLET | Freq: Three times a day (TID) | ORAL | 2 refills | 0.00000 days | Status: CP | PRN
Start: 2019-01-03 — End: ?

## 2019-01-03 MED ORDER — OXYCODONE 10 MG TABLET: 10 mg | tablet | Freq: Four times a day (QID) | 0 refills | 0 days | Status: AC

## 2019-01-05 MED ORDER — OXYCODONE 10 MG TABLET
ORAL_TABLET | Freq: Four times a day (QID) | ORAL | 0 refills | 0.00000 days | Status: CP | PRN
Start: 2019-01-05 — End: 2019-01-03

## 2019-01-05 MED ORDER — OXYCODONE ER 60 MG TABLET,CRUSH RESISTANT,EXTENDED RELEASE 12 HR
ORAL_TABLET | Freq: Three times a day (TID) | ORAL | 0 refills | 0 days | Status: CP
Start: 2019-01-05 — End: 2019-03-02

## 2019-02-04 MED ORDER — OXYCODONE ER 60 MG TABLET,CRUSH RESISTANT,EXTENDED RELEASE 12 HR
ORAL_TABLET | Freq: Three times a day (TID) | ORAL | 0 refills | 0 days | Status: CP
Start: 2019-02-04 — End: 2019-03-06

## 2019-02-04 MED ORDER — OXYCODONE 10 MG TABLET
ORAL_TABLET | Freq: Four times a day (QID) | ORAL | 0 refills | 0 days | Status: CP | PRN
Start: 2019-02-04 — End: 2019-03-02

## 2019-03-02 MED ORDER — OXYCODONE 10 MG TABLET
ORAL_TABLET | Freq: Four times a day (QID) | ORAL | 0 refills | 0 days | Status: CP | PRN
Start: 2019-03-02 — End: 2019-03-06

## 2019-03-02 MED ORDER — OXYCODONE ER 60 MG TABLET,CRUSH RESISTANT,EXTENDED RELEASE 12 HR
ORAL_TABLET | Freq: Three times a day (TID) | ORAL | 0 refills | 0.00000 days | Status: CP
Start: 2019-03-02 — End: 2019-03-06

## 2019-03-06 ENCOUNTER — Encounter: Admit: 2019-03-06 | Discharge: 2019-03-07 | Payer: MEDICARE

## 2019-03-06 DIAGNOSIS — M542 Cervicalgia: Secondary | ICD-10-CM

## 2019-03-06 DIAGNOSIS — M961 Postlaminectomy syndrome, not elsewhere classified: Secondary | ICD-10-CM

## 2019-03-06 DIAGNOSIS — Z79891 Long term (current) use of opiate analgesic: Principal | ICD-10-CM

## 2019-03-06 DIAGNOSIS — M5431 Sciatica, right side: Secondary | ICD-10-CM

## 2019-03-07 MED ORDER — OXYCODONE 10 MG TABLET: 10 mg | tablet | Freq: Four times a day (QID) | 0 refills | 0 days | Status: AC

## 2019-04-01 MED ORDER — OXYCODONE 10 MG TABLET: 10 mg | tablet | Freq: Four times a day (QID) | 0 refills | 30 days | Status: AC

## 2019-04-05 MED ORDER — OXYCODONE 10 MG TABLET
ORAL_TABLET | Freq: Four times a day (QID) | ORAL | 0 refills | 0.00000 days | Status: CP | PRN
Start: 2019-04-05 — End: 2019-04-01

## 2019-04-05 MED ORDER — OXYCODONE ER 60 MG TABLET,CRUSH RESISTANT,EXTENDED RELEASE 12 HR
ORAL_TABLET | Freq: Three times a day (TID) | ORAL | 0 refills | 0.00000 days | Status: CP
Start: 2019-04-05 — End: ?

## 2019-05-05 MED ORDER — OXYCODONE ER 60 MG TABLET,CRUSH RESISTANT,EXTENDED RELEASE 12 HR
ORAL_TABLET | Freq: Three times a day (TID) | ORAL | 0 refills | 0.00000 days | Status: CP
Start: 2019-05-05 — End: ?

## 2019-05-05 MED ORDER — OXYCODONE 10 MG TABLET
ORAL_TABLET | Freq: Four times a day (QID) | ORAL | 0 refills | 30.00000 days | Status: CP | PRN
Start: 2019-05-05 — End: 2019-04-01

## 2019-05-25 ENCOUNTER — Encounter: Admit: 2019-05-25 | Discharge: 2019-05-26 | Payer: MEDICARE

## 2019-05-25 DIAGNOSIS — M961 Postlaminectomy syndrome, not elsewhere classified: Secondary | ICD-10-CM

## 2019-05-25 DIAGNOSIS — M542 Cervicalgia: Secondary | ICD-10-CM

## 2019-05-25 DIAGNOSIS — Z79891 Long term (current) use of opiate analgesic: Secondary | ICD-10-CM

## 2019-05-25 DIAGNOSIS — M5431 Sciatica, right side: Secondary | ICD-10-CM

## 2019-05-25 DIAGNOSIS — L309 Dermatitis, unspecified: Secondary | ICD-10-CM

## 2019-05-25 MED ORDER — HYDROXYZINE HCL 25 MG TABLET
ORAL_TABLET | Freq: Two times a day (BID) | ORAL | 0 refills | 30 days | Status: CP | PRN
Start: 2019-05-25 — End: ?

## 2019-06-04 MED ORDER — OXYCODONE ER 60 MG TABLET,CRUSH RESISTANT,EXTENDED RELEASE 12 HR
ORAL_TABLET | Freq: Three times a day (TID) | ORAL | 0 refills | 30 days | Status: CP
Start: 2019-06-04 — End: ?

## 2019-06-04 MED ORDER — OXYCODONE 10 MG TABLET
ORAL_TABLET | Freq: Four times a day (QID) | ORAL | 0 refills | 30 days | Status: CP | PRN
Start: 2019-06-04 — End: ?

## 2019-07-04 MED ORDER — OXYCODONE 10 MG TABLET
ORAL_TABLET | Freq: Four times a day (QID) | ORAL | 0 refills | 30 days | Status: CP | PRN
Start: 2019-07-04 — End: ?

## 2019-07-04 MED ORDER — OXYCODONE ER 60 MG TABLET,CRUSH RESISTANT,EXTENDED RELEASE 12 HR
ORAL_TABLET | Freq: Three times a day (TID) | ORAL | 0 refills | 30 days | Status: CP
Start: 2019-07-04 — End: ?

## 2019-07-20 DIAGNOSIS — M5431 Sciatica, right side: Principal | ICD-10-CM

## 2019-07-20 DIAGNOSIS — M961 Postlaminectomy syndrome, not elsewhere classified: Principal | ICD-10-CM

## 2019-07-20 DIAGNOSIS — M542 Cervicalgia: Principal | ICD-10-CM

## 2019-07-20 DIAGNOSIS — Z79891 Long term (current) use of opiate analgesic: Principal | ICD-10-CM

## 2019-08-03 MED ORDER — OXYCODONE ER 60 MG TABLET,CRUSH RESISTANT,EXTENDED RELEASE 12 HR
ORAL_TABLET | Freq: Three times a day (TID) | ORAL | 0 refills | 18.00000 days | Status: CP
Start: 2019-08-03 — End: 2019-08-21

## 2019-08-03 MED ORDER — OXYCODONE 10 MG TABLET
ORAL_TABLET | Freq: Four times a day (QID) | ORAL | 0 refills | 18.00000 days | Status: CP | PRN
Start: 2019-08-03 — End: 2019-08-21

## 2019-08-20 ENCOUNTER — Encounter: Admit: 2019-08-20 | Discharge: 2019-08-21 | Payer: MEDICARE

## 2019-08-20 DIAGNOSIS — M961 Postlaminectomy syndrome, not elsewhere classified: Principal | ICD-10-CM

## 2019-08-20 DIAGNOSIS — M542 Cervicalgia: Principal | ICD-10-CM

## 2019-08-20 DIAGNOSIS — Z79891 Long term (current) use of opiate analgesic: Principal | ICD-10-CM

## 2019-08-20 DIAGNOSIS — M5431 Sciatica, right side: Principal | ICD-10-CM

## 2019-08-20 MED ORDER — CYCLOBENZAPRINE 10 MG TABLET
ORAL_TABLET | Freq: Three times a day (TID) | ORAL | 2 refills | 30 days | Status: CP | PRN
Start: 2019-08-20 — End: ?

## 2019-08-20 MED ORDER — NARCAN 4 MG/ACTUATION NASAL SPRAY: 1 | each | Freq: Once | 0 refills | 0 days | Status: AC

## 2019-08-20 MED ORDER — LUBIPROSTONE 24 MCG CAPSULE
ORAL_CAPSULE | Freq: Every day | ORAL | 0 refills | 90.00000 days | Status: CP | PRN
Start: 2019-08-20 — End: ?

## 2019-08-20 MED ORDER — NARCAN 4 MG/ACTUATION NASAL SPRAY
Freq: Once | NASAL | 0 refills | 0.00000 days | Status: CP | PRN
Start: 2019-08-20 — End: 2019-08-20

## 2019-08-21 MED ORDER — OXYCODONE ER 60 MG TABLET,CRUSH RESISTANT,EXTENDED RELEASE 12 HR
ORAL_TABLET | Freq: Three times a day (TID) | ORAL | 0 refills | 30.00000 days | Status: CP
Start: 2019-08-21 — End: ?

## 2019-08-21 MED ORDER — OXYCODONE 10 MG TABLET
ORAL_TABLET | Freq: Four times a day (QID) | ORAL | 0 refills | 30 days | Status: CP | PRN
Start: 2019-08-21 — End: ?

## 2019-08-22 MED ORDER — OXYCODONE 10 MG TABLET
ORAL_TABLET | Freq: Four times a day (QID) | ORAL | 0 refills | 30.00000 days | Status: CP | PRN
Start: 2019-08-22 — End: ?

## 2019-08-22 MED ORDER — OXYCODONE ER 60 MG TABLET,CRUSH RESISTANT,EXTENDED RELEASE 12 HR
ORAL_TABLET | Freq: Three times a day (TID) | ORAL | 0 refills | 30 days | Status: CP
Start: 2019-08-22 — End: ?

## 2019-09-20 MED ORDER — OXYCODONE 10 MG TABLET
ORAL_TABLET | Freq: Four times a day (QID) | ORAL | 0 refills | 30 days | Status: CP | PRN
Start: 2019-09-20 — End: ?

## 2019-09-20 MED ORDER — OXYCODONE ER 60 MG TABLET,CRUSH RESISTANT,EXTENDED RELEASE 12 HR
ORAL_TABLET | Freq: Three times a day (TID) | ORAL | 0 refills | 30 days | Status: CP
Start: 2019-09-20 — End: ?

## 2019-09-21 MED ORDER — OXYCODONE 10 MG TABLET
ORAL_TABLET | Freq: Four times a day (QID) | ORAL | 0 refills | 30.00000 days | Status: CP | PRN
Start: 2019-09-21 — End: ?

## 2019-09-21 MED ORDER — OXYCODONE ER 60 MG TABLET,CRUSH RESISTANT,EXTENDED RELEASE 12 HR
ORAL_TABLET | Freq: Three times a day (TID) | ORAL | 0 refills | 30 days | Status: CP
Start: 2019-09-21 — End: ?

## 2019-10-15 ENCOUNTER — Encounter: Admit: 2019-10-15 | Discharge: 2019-10-16 | Payer: MEDICARE

## 2019-10-15 MED ORDER — LUBIPROSTONE 24 MCG CAPSULE
ORAL_CAPSULE | Freq: Every day | ORAL | 0 refills | 90 days | Status: CP | PRN
Start: 2019-10-15 — End: ?

## 2019-10-19 MED ORDER — XTAMPZA ER 27 MG CAPSULE SPRINKLE
ORAL_CAPSULE | Freq: Three times a day (TID) | ORAL | 0 refills | 0.00000 days | Status: CP
Start: 2019-10-19 — End: 2019-10-19

## 2019-10-19 MED ORDER — XTAMPZA ER 27 MG CAPSULE SPRINKLE: 54 mg | capsule | Freq: Three times a day (TID) | 0 refills | 0 days | Status: AC

## 2019-10-20 MED ORDER — OXYCODONE 10 MG TABLET
ORAL_TABLET | Freq: Four times a day (QID) | ORAL | 0 refills | 30 days | Status: CP | PRN
Start: 2019-10-20 — End: ?

## 2019-10-20 MED ORDER — OXYCODONE ER 60 MG TABLET,CRUSH RESISTANT,EXTENDED RELEASE 12 HR
ORAL_TABLET | Freq: Three times a day (TID) | ORAL | 0 refills | 30 days | Status: CP
Start: 2019-10-20 — End: ?

## 2019-11-03 MED ORDER — XTAMPZA ER 27 MG CAPSULE SPRINKLE
Freq: Three times a day (TID) | ORAL | 0 refills | 0 days | Status: CP
Start: 2019-11-03 — End: ?

## 2019-11-16 ENCOUNTER — Encounter: Admit: 2019-11-16 | Discharge: 2019-11-17 | Payer: MEDICARE

## 2019-11-16 MED ORDER — ONDANSETRON 4 MG DISINTEGRATING TABLET
ORAL_TABLET | Freq: Three times a day (TID) | ORAL | 0 refills | 14.00000 days | Status: CP | PRN
Start: 2019-11-16 — End: 2019-11-30

## 2019-11-16 MED ORDER — OXYCODONE 10 MG TABLET: 10 mg | tablet | Freq: Four times a day (QID) | 0 refills | 14 days | Status: AC

## 2019-11-19 MED ORDER — XTAMPZA ER 27 MG CAPSULE SPRINKLE
Freq: Three times a day (TID) | ORAL | 0 refills | 14 days | Status: CP
Start: 2019-11-19 — End: 2019-12-03

## 2019-11-19 MED ORDER — OXYCODONE 10 MG TABLET
ORAL_TABLET | Freq: Four times a day (QID) | ORAL | 0 refills | 30.00000 days | Status: CP | PRN
Start: 2019-11-19 — End: 2019-12-03

## 2019-11-19 MED ORDER — OXYCODONE ER 60 MG TABLET,CRUSH RESISTANT,EXTENDED RELEASE 12 HR
ORAL_TABLET | Freq: Three times a day (TID) | ORAL | 0 refills | 30 days | Status: CP
Start: 2019-11-19 — End: ?

## 2019-11-22 DIAGNOSIS — M5431 Sciatica, right side: Principal | ICD-10-CM

## 2019-11-22 DIAGNOSIS — M961 Postlaminectomy syndrome, not elsewhere classified: Principal | ICD-10-CM

## 2019-11-22 MED ORDER — OXYCODONE ER 60 MG TABLET,CRUSH RESISTANT,EXTENDED RELEASE 12 HR
ORAL_TABLET | Freq: Three times a day (TID) | ORAL | 0 refills | 30 days | Status: CP
Start: 2019-11-22 — End: ?

## 2019-11-29 ENCOUNTER — Ambulatory Visit: Admit: 2019-11-29 | Discharge: 2019-11-30 | Payer: MEDICARE

## 2019-11-29 DIAGNOSIS — Z0289 Encounter for other administrative examinations: Principal | ICD-10-CM

## 2019-11-29 DIAGNOSIS — Z79891 Long term (current) use of opiate analgesic: Principal | ICD-10-CM

## 2019-11-29 DIAGNOSIS — M961 Postlaminectomy syndrome, not elsewhere classified: Principal | ICD-10-CM

## 2019-11-29 DIAGNOSIS — L309 Dermatitis, unspecified: Principal | ICD-10-CM

## 2019-11-29 DIAGNOSIS — M5431 Sciatica, right side: Principal | ICD-10-CM

## 2019-11-29 DIAGNOSIS — M542 Cervicalgia: Principal | ICD-10-CM

## 2019-11-29 MED ORDER — HYDROXYZINE HCL 25 MG TABLET
ORAL_TABLET | Freq: Two times a day (BID) | ORAL | 0 refills | 30.00000 days | Status: CP | PRN
Start: 2019-11-29 — End: ?

## 2019-11-29 MED ORDER — LUBIPROSTONE 24 MCG CAPSULE
ORAL_CAPSULE | Freq: Every day | ORAL | 0 refills | 90 days | Status: CP | PRN
Start: 2019-11-29 — End: ?

## 2019-11-30 MED ORDER — OXYCODONE 10 MG TABLET
ORAL_TABLET | Freq: Four times a day (QID) | ORAL | 0 refills | 30.00000 days | Status: CP | PRN
Start: 2019-11-30 — End: ?

## 2019-12-23 MED ORDER — OXYCODONE ER 60 MG TABLET,CRUSH RESISTANT,EXTENDED RELEASE 12 HR
ORAL_TABLET | Freq: Three times a day (TID) | ORAL | 0 refills | 30 days | Status: CP
Start: 2019-12-23 — End: ?

## 2019-12-30 MED ORDER — OXYCODONE 10 MG TABLET
ORAL_TABLET | Freq: Four times a day (QID) | ORAL | 0 refills | 30 days | Status: CP | PRN
Start: 2019-12-30 — End: ?

## 2020-01-22 MED ORDER — OXYCODONE ER 60 MG TABLET,CRUSH RESISTANT,EXTENDED RELEASE 12 HR
ORAL_TABLET | Freq: Three times a day (TID) | ORAL | 0 refills | 30 days | Status: CP
Start: 2020-01-22 — End: ?

## 2020-01-29 MED ORDER — OXYCODONE 10 MG TABLET
ORAL_TABLET | Freq: Four times a day (QID) | ORAL | 0 refills | 30 days | Status: CP | PRN
Start: 2020-01-29 — End: ?

## 2020-01-30 DIAGNOSIS — L309 Dermatitis, unspecified: Principal | ICD-10-CM

## 2020-01-30 DIAGNOSIS — M542 Cervicalgia: Principal | ICD-10-CM

## 2020-01-30 DIAGNOSIS — M5431 Sciatica, right side: Principal | ICD-10-CM

## 2020-01-30 DIAGNOSIS — M961 Postlaminectomy syndrome, not elsewhere classified: Principal | ICD-10-CM

## 2020-01-30 DIAGNOSIS — Z79891 Long term (current) use of opiate analgesic: Principal | ICD-10-CM

## 2020-01-31 DIAGNOSIS — M5431 Sciatica, right side: Principal | ICD-10-CM

## 2020-01-31 DIAGNOSIS — M542 Cervicalgia: Principal | ICD-10-CM

## 2020-01-31 DIAGNOSIS — Z79891 Long term (current) use of opiate analgesic: Principal | ICD-10-CM

## 2020-01-31 DIAGNOSIS — M961 Postlaminectomy syndrome, not elsewhere classified: Principal | ICD-10-CM

## 2020-02-01 DIAGNOSIS — M542 Cervicalgia: Principal | ICD-10-CM

## 2020-02-01 DIAGNOSIS — M5431 Sciatica, right side: Principal | ICD-10-CM

## 2020-02-01 DIAGNOSIS — Z79891 Long term (current) use of opiate analgesic: Principal | ICD-10-CM

## 2020-02-01 DIAGNOSIS — L309 Dermatitis, unspecified: Principal | ICD-10-CM

## 2020-02-01 DIAGNOSIS — M961 Postlaminectomy syndrome, not elsewhere classified: Principal | ICD-10-CM

## 2020-02-01 MED ORDER — OXYCODONE 10 MG TABLET
ORAL_TABLET | Freq: Four times a day (QID) | ORAL | 0 refills | 30 days | Status: CP | PRN
Start: 2020-02-01 — End: ?

## 2020-02-01 MED ORDER — NARCAN 4 MG/ACTUATION NASAL SPRAY
0 refills | 0 days | Status: CP
Start: 2020-02-01 — End: ?

## 2020-02-01 MED ORDER — LUBIPROSTONE 24 MCG CAPSULE
ORAL_CAPSULE | Freq: Every day | ORAL | 0 refills | 90 days | Status: CP | PRN
Start: 2020-02-01 — End: ?

## 2020-02-01 MED ORDER — CYCLOBENZAPRINE 10 MG TABLET
ORAL_TABLET | Freq: Three times a day (TID) | ORAL | 2 refills | 30 days | Status: CP | PRN
Start: 2020-02-01 — End: ?

## 2020-02-01 MED ORDER — HYDROXYZINE HCL 25 MG TABLET
ORAL_TABLET | Freq: Two times a day (BID) | ORAL | 0 refills | 30 days | Status: CP | PRN
Start: 2020-02-01 — End: ?

## 2020-02-03 DIAGNOSIS — M961 Postlaminectomy syndrome, not elsewhere classified: Principal | ICD-10-CM

## 2020-02-03 DIAGNOSIS — M542 Cervicalgia: Principal | ICD-10-CM

## 2020-02-03 DIAGNOSIS — M5431 Sciatica, right side: Principal | ICD-10-CM

## 2020-02-03 DIAGNOSIS — Z79891 Long term (current) use of opiate analgesic: Principal | ICD-10-CM

## 2020-02-03 MED ORDER — OXYCODONE ER 60 MG TABLET,CRUSH RESISTANT,EXTENDED RELEASE 12 HR
ORAL_TABLET | Freq: Three times a day (TID) | ORAL | 0 refills | 30 days | Status: CP
Start: 2020-02-03 — End: ?

## 2020-02-03 MED ORDER — OXYCODONE 10 MG TABLET
ORAL_TABLET | Freq: Four times a day (QID) | ORAL | 0 refills | 30 days | Status: CP | PRN
Start: 2020-02-03 — End: ?

## 2020-02-15 ENCOUNTER — Ambulatory Visit: Admit: 2020-02-15 | Payer: MEDICARE

## 2020-02-21 ENCOUNTER — Ambulatory Visit: Admit: 2020-02-21 | Discharge: 2020-02-22 | Payer: MEDICARE

## 2020-02-21 DIAGNOSIS — G894 Chronic pain syndrome: Principal | ICD-10-CM

## 2020-02-21 DIAGNOSIS — M961 Postlaminectomy syndrome, not elsewhere classified: Principal | ICD-10-CM

## 2020-02-21 DIAGNOSIS — Z79891 Long term (current) use of opiate analgesic: Principal | ICD-10-CM

## 2020-02-21 DIAGNOSIS — M542 Cervicalgia: Principal | ICD-10-CM

## 2020-02-21 DIAGNOSIS — M5431 Sciatica, right side: Principal | ICD-10-CM

## 2020-02-21 MED ORDER — CYCLOBENZAPRINE 10 MG TABLET
ORAL_TABLET | Freq: Three times a day (TID) | ORAL | 2 refills | 30 days | Status: CP | PRN
Start: 2020-02-21 — End: ?

## 2020-02-21 MED ORDER — OXYCODONE ER 60 MG TABLET,CRUSH RESISTANT,EXTENDED RELEASE 12 HR
ORAL_TABLET | Freq: Three times a day (TID) | ORAL | 0 refills | 30 days | Status: CP
Start: 2020-02-21 — End: 2020-03-22
  Filled 2020-02-21: qty 90, 30d supply, fill #0

## 2020-02-21 MED ORDER — LUBIPROSTONE 24 MCG CAPSULE
ORAL_CAPSULE | Freq: Every day | ORAL | 0 refills | 90 days | Status: CP | PRN
Start: 2020-02-21 — End: ?

## 2020-02-21 MED FILL — OXYCONTIN 60 MG TABLET,CRUSH RESISTANT,EXTENDED RELEASE: 30 days supply | Qty: 90 | Fill #0 | Status: AC

## 2020-03-02 MED ORDER — OXYCODONE 10 MG TABLET
ORAL_TABLET | Freq: Four times a day (QID) | ORAL | 0 refills | 30 days | Status: CP | PRN
Start: 2020-03-02 — End: 2020-04-01

## 2020-03-22 MED ORDER — OXYCODONE ER 60 MG TABLET,CRUSH RESISTANT,EXTENDED RELEASE 12 HR
ORAL_TABLET | Freq: Three times a day (TID) | ORAL | 0 refills | 30.00000 days | Status: CP
Start: 2020-03-22 — End: 2020-04-21

## 2020-04-01 MED ORDER — OXYCODONE 10 MG TABLET
ORAL_TABLET | Freq: Four times a day (QID) | ORAL | 0 refills | 30 days | Status: CP | PRN
Start: 2020-04-01 — End: 2020-05-01

## 2020-04-21 MED ORDER — OXYCODONE ER 60 MG TABLET,CRUSH RESISTANT,EXTENDED RELEASE 12 HR
ORAL_TABLET | Freq: Three times a day (TID) | ORAL | 0 refills | 30.00000 days | Status: CP
Start: 2020-04-21 — End: 2020-05-21

## 2020-05-01 MED ORDER — OXYCODONE 10 MG TABLET
ORAL_TABLET | Freq: Four times a day (QID) | ORAL | 0 refills | 30 days | Status: CP | PRN
Start: 2020-05-01 — End: 2020-05-31

## 2020-05-21 ENCOUNTER — Telehealth: Admit: 2020-05-21 | Discharge: 2020-05-22 | Payer: MEDICARE

## 2020-05-21 DIAGNOSIS — Z79891 Long term (current) use of opiate analgesic: Principal | ICD-10-CM

## 2020-05-21 DIAGNOSIS — G894 Chronic pain syndrome: Principal | ICD-10-CM

## 2020-05-21 DIAGNOSIS — M542 Cervicalgia: Principal | ICD-10-CM

## 2020-05-21 DIAGNOSIS — M961 Postlaminectomy syndrome, not elsewhere classified: Principal | ICD-10-CM

## 2020-05-21 MED ORDER — OXYCODONE ER 60 MG TABLET,CRUSH RESISTANT,EXTENDED RELEASE 12 HR
ORAL_TABLET | Freq: Three times a day (TID) | ORAL | 0 refills | 30 days | Status: CP
Start: 2020-05-21 — End: 2020-06-20

## 2020-05-31 MED ORDER — OXYCODONE 10 MG TABLET
ORAL_TABLET | Freq: Four times a day (QID) | ORAL | 0 refills | 30 days | Status: CP | PRN
Start: 2020-05-31 — End: 2020-06-30

## 2020-06-11 ENCOUNTER — Encounter: Admit: 2020-06-11 | Payer: MEDICARE

## 2020-06-20 MED ORDER — OXYCODONE ER 60 MG TABLET,CRUSH RESISTANT,EXTENDED RELEASE 12 HR
ORAL_TABLET | Freq: Three times a day (TID) | ORAL | 0 refills | 30 days | Status: CP
Start: 2020-06-20 — End: 2020-07-20

## 2020-06-30 MED ORDER — OXYCODONE 10 MG TABLET
ORAL_TABLET | Freq: Four times a day (QID) | ORAL | 0 refills | 30 days | Status: CP | PRN
Start: 2020-06-30 — End: 2020-07-30

## 2020-07-01 ENCOUNTER — Encounter: Admit: 2020-07-01 | Discharge: 2020-07-02 | Payer: MEDICARE

## 2020-07-01 DIAGNOSIS — Z79891 Long term (current) use of opiate analgesic: Principal | ICD-10-CM

## 2020-07-01 DIAGNOSIS — M961 Postlaminectomy syndrome, not elsewhere classified: Principal | ICD-10-CM

## 2020-07-01 DIAGNOSIS — G894 Chronic pain syndrome: Principal | ICD-10-CM

## 2020-07-01 MED ORDER — OXYCODONE ER 60 MG TABLET,CRUSH RESISTANT,EXTENDED RELEASE 12 HR: 60 mg | tablet | Freq: Three times a day (TID) | 0 refills | 30 days | Status: AC

## 2020-07-01 MED ORDER — LUBIPROSTONE 24 MCG CAPSULE
ORAL_CAPSULE | Freq: Every day | ORAL | 0 refills | 90.00000 days | Status: CP | PRN
Start: 2020-07-01 — End: ?

## 2020-07-01 MED ORDER — OXYCODONE 10 MG TABLET: 10 mg | tablet | Freq: Four times a day (QID) | 0 refills | 30 days | Status: AC

## 2020-07-18 DIAGNOSIS — M961 Postlaminectomy syndrome, not elsewhere classified: Principal | ICD-10-CM

## 2020-07-20 MED ORDER — OXYCODONE ER 60 MG TABLET,CRUSH RESISTANT,EXTENDED RELEASE 12 HR
ORAL_TABLET | Freq: Three times a day (TID) | ORAL | 0 refills | 30.00000 days | Status: CP
Start: 2020-07-20 — End: 2020-08-19

## 2020-07-30 MED ORDER — OXYCODONE 10 MG TABLET
ORAL_TABLET | Freq: Four times a day (QID) | ORAL | 0 refills | 30 days | Status: CP | PRN
Start: 2020-07-30 — End: 2020-08-29

## 2020-08-19 MED ORDER — OXYCODONE ER 60 MG TABLET,CRUSH RESISTANT,EXTENDED RELEASE 12 HR
ORAL_TABLET | Freq: Three times a day (TID) | ORAL | 0 refills | 30.00000 days | Status: CP
Start: 2020-08-19 — End: 2020-09-18

## 2020-08-29 MED ORDER — OXYCODONE 10 MG TABLET
ORAL_TABLET | Freq: Four times a day (QID) | ORAL | 0 refills | 30 days | Status: CP | PRN
Start: 2020-08-29 — End: 2020-09-28

## 2020-09-18 MED ORDER — OXYCODONE ER 60 MG TABLET,CRUSH RESISTANT,EXTENDED RELEASE 12 HR
ORAL_TABLET | Freq: Three times a day (TID) | ORAL | 0 refills | 30 days | Status: CP
Start: 2020-09-18 — End: 2020-10-18

## 2020-09-23 ENCOUNTER — Encounter: Admit: 2020-09-23 | Discharge: 2020-09-24 | Payer: MEDICARE

## 2020-09-23 DIAGNOSIS — G894 Chronic pain syndrome: Principal | ICD-10-CM

## 2020-09-23 DIAGNOSIS — Z79891 Long term (current) use of opiate analgesic: Principal | ICD-10-CM

## 2020-09-23 DIAGNOSIS — M961 Postlaminectomy syndrome, not elsewhere classified: Principal | ICD-10-CM

## 2020-09-23 MED ORDER — LUBIPROSTONE 24 MCG CAPSULE
ORAL_CAPSULE | Freq: Every day | ORAL | 0 refills | 90 days | Status: CP | PRN
Start: 2020-09-23 — End: ?

## 2020-09-23 MED ORDER — TIZANIDINE 4 MG TABLET
ORAL_TABLET | Freq: Three times a day (TID) | ORAL | 2 refills | 30 days | Status: CP | PRN
Start: 2020-09-23 — End: ?

## 2020-09-28 MED ORDER — OXYCODONE 10 MG TABLET
ORAL_TABLET | Freq: Four times a day (QID) | ORAL | 0 refills | 20 days | Status: CP | PRN
Start: 2020-09-28 — End: 2020-10-18

## 2020-10-18 MED ORDER — OXYCODONE ER 60 MG TABLET,CRUSH RESISTANT,EXTENDED RELEASE 12 HR
ORAL_TABLET | Freq: Three times a day (TID) | ORAL | 0 refills | 30.00000 days | Status: CP
Start: 2020-10-18 — End: 2020-11-17

## 2020-10-18 MED ORDER — OXYCODONE 10 MG TABLET
ORAL_TABLET | Freq: Four times a day (QID) | ORAL | 0 refills | 30 days | Status: CP | PRN
Start: 2020-10-18 — End: 2020-11-17

## 2020-11-17 MED ORDER — OXYCODONE 10 MG TABLET
ORAL_TABLET | Freq: Four times a day (QID) | ORAL | 0 refills | 30 days | Status: CP | PRN
Start: 2020-11-17 — End: 2020-12-17

## 2020-11-17 MED ORDER — OXYCODONE ER 60 MG TABLET,CRUSH RESISTANT,EXTENDED RELEASE 12 HR
ORAL_TABLET | Freq: Three times a day (TID) | ORAL | 0 refills | 30 days | Status: CP
Start: 2020-11-17 — End: 2020-12-17

## 2020-12-17 MED ORDER — OXYCODONE ER 60 MG TABLET,CRUSH RESISTANT,EXTENDED RELEASE 12 HR
ORAL_TABLET | Freq: Three times a day (TID) | ORAL | 0 refills | 30 days | Status: CP
Start: 2020-12-17 — End: 2021-01-16

## 2020-12-17 MED ORDER — OXYCODONE 10 MG TABLET
ORAL_TABLET | Freq: Four times a day (QID) | ORAL | 0 refills | 30.00000 days | Status: CP | PRN
Start: 2020-12-17 — End: 2021-01-16

## 2020-12-22 ENCOUNTER — Encounter: Admit: 2020-12-22 | Discharge: 2020-12-23 | Payer: MEDICARE

## 2020-12-22 DIAGNOSIS — G894 Chronic pain syndrome: Principal | ICD-10-CM

## 2020-12-22 DIAGNOSIS — L309 Dermatitis, unspecified: Principal | ICD-10-CM

## 2020-12-22 DIAGNOSIS — Z0289 Encounter for other administrative examinations: Principal | ICD-10-CM

## 2020-12-22 DIAGNOSIS — M961 Postlaminectomy syndrome, not elsewhere classified: Principal | ICD-10-CM

## 2020-12-22 MED ORDER — HYDROXYZINE HCL 25 MG TABLET
ORAL_TABLET | Freq: Two times a day (BID) | ORAL | 0 refills | 30 days | Status: CP | PRN
Start: 2020-12-22 — End: ?

## 2021-01-16 MED ORDER — OXYCODONE 10 MG TABLET
ORAL_TABLET | Freq: Four times a day (QID) | ORAL | 0 refills | 30.00000 days | Status: CP | PRN
Start: 2021-01-16 — End: 2021-02-15

## 2021-01-16 MED ORDER — OXYCODONE ER 60 MG TABLET,CRUSH RESISTANT,EXTENDED RELEASE 12 HR
ORAL_TABLET | Freq: Three times a day (TID) | ORAL | 0 refills | 30.00000 days | Status: CP
Start: 2021-01-16 — End: 2021-02-15

## 2021-02-15 MED ORDER — OXYCODONE ER 60 MG TABLET,CRUSH RESISTANT,EXTENDED RELEASE 12 HR
ORAL_TABLET | Freq: Three times a day (TID) | ORAL | 0 refills | 30.00000 days | Status: CP
Start: 2021-02-15 — End: 2021-03-17

## 2021-02-15 MED ORDER — OXYCODONE 10 MG TABLET
ORAL_TABLET | Freq: Four times a day (QID) | ORAL | 0 refills | 30 days | Status: CP | PRN
Start: 2021-02-15 — End: 2021-03-17

## 2021-03-17 MED ORDER — OXYCODONE 10 MG TABLET
ORAL_TABLET | Freq: Four times a day (QID) | ORAL | 0 refills | 30 days | Status: CP | PRN
Start: 2021-03-17 — End: 2021-04-16

## 2021-03-17 MED ORDER — OXYCODONE ER 60 MG TABLET,CRUSH RESISTANT,EXTENDED RELEASE 12 HR
ORAL_TABLET | Freq: Three times a day (TID) | ORAL | 0 refills | 30.00000 days | Status: CP
Start: 2021-03-17 — End: 2021-04-16

## 2021-03-23 ENCOUNTER — Ambulatory Visit: Admit: 2021-03-23 | Discharge: 2021-03-24 | Payer: MEDICARE

## 2021-03-23 DIAGNOSIS — G894 Chronic pain syndrome: Principal | ICD-10-CM

## 2021-03-23 DIAGNOSIS — M961 Postlaminectomy syndrome, not elsewhere classified: Principal | ICD-10-CM

## 2021-04-16 MED ORDER — OXYCODONE ER 60 MG TABLET,CRUSH RESISTANT,EXTENDED RELEASE 12 HR
ORAL_TABLET | Freq: Three times a day (TID) | ORAL | 0 refills | 30.00000 days | Status: CP
Start: 2021-04-16 — End: 2021-05-16

## 2021-04-16 MED ORDER — OXYCODONE 10 MG TABLET
ORAL_TABLET | Freq: Four times a day (QID) | ORAL | 0 refills | 30 days | Status: CP | PRN
Start: 2021-04-16 — End: 2021-05-16

## 2021-05-13 DIAGNOSIS — M961 Postlaminectomy syndrome, not elsewhere classified: Principal | ICD-10-CM

## 2021-05-15 MED ORDER — OXYCODONE ER 60 MG TABLET,CRUSH RESISTANT,EXTENDED RELEASE 12 HR
ORAL_TABLET | Freq: Three times a day (TID) | ORAL | 0 refills | 30 days | Status: CP
Start: 2021-05-15 — End: 2021-06-14

## 2021-05-15 MED ORDER — OXYCODONE 10 MG TABLET
ORAL_TABLET | Freq: Four times a day (QID) | ORAL | 0 refills | 30.00000 days | Status: CP | PRN
Start: 2021-05-15 — End: 2021-06-14

## 2021-05-16 MED ORDER — OXYCODONE ER 60 MG TABLET,CRUSH RESISTANT,EXTENDED RELEASE 12 HR
ORAL_TABLET | Freq: Three times a day (TID) | ORAL | 0 refills | 30.00000 days | Status: CP
Start: 2021-05-16 — End: 2021-06-15

## 2021-05-16 MED ORDER — OXYCODONE ER 60 MG TABLET,CRUSH RESISTANT,EXTENDED RELEASE 12 HR: ORAL | 0 refills | 30 days | Status: CP

## 2021-05-16 MED ORDER — OXYCODONE 10 MG TABLET
ORAL_TABLET | Freq: Four times a day (QID) | ORAL | 0 refills | 30.00000 days | Status: CP | PRN
Start: 2021-05-16 — End: 2021-05-13

## 2021-05-16 MED ORDER — OXYCODONE 10 MG TABLET: ORAL | 0 refills | 30 days | Status: CP

## 2021-06-15 MED ORDER — OXYCODONE ER 60 MG TABLET,CRUSH RESISTANT,EXTENDED RELEASE 12 HR
ORAL_TABLET | Freq: Three times a day (TID) | ORAL | 0 refills | 30.00000 days | Status: CP
Start: 2021-06-15 — End: 2021-07-15

## 2021-06-15 MED ORDER — OXYCODONE 10 MG TABLET
ORAL_TABLET | Freq: Four times a day (QID) | ORAL | 0 refills | 30 days | Status: CP | PRN
Start: 2021-06-15 — End: 2021-07-15

## 2021-06-19 ENCOUNTER — Ambulatory Visit: Admit: 2021-06-19 | Discharge: 2021-06-20 | Payer: MEDICARE

## 2021-06-19 DIAGNOSIS — G894 Chronic pain syndrome: Principal | ICD-10-CM

## 2021-06-19 DIAGNOSIS — M961 Postlaminectomy syndrome, not elsewhere classified: Principal | ICD-10-CM

## 2021-06-19 MED ORDER — TIZANIDINE 4 MG TABLET
ORAL_TABLET | Freq: Three times a day (TID) | ORAL | 2 refills | 30.00000 days | Status: CP | PRN
Start: 2021-06-19 — End: ?

## 2021-06-24 DIAGNOSIS — M961 Postlaminectomy syndrome, not elsewhere classified: Principal | ICD-10-CM

## 2021-07-15 MED ORDER — OXYCODONE ER 60 MG TABLET,CRUSH RESISTANT,EXTENDED RELEASE 12 HR
ORAL_TABLET | Freq: Three times a day (TID) | ORAL | 0 refills | 30.00000 days | Status: CP
Start: 2021-07-15 — End: 2021-08-14

## 2021-07-15 MED ORDER — OXYCODONE 10 MG TABLET
ORAL_TABLET | Freq: Four times a day (QID) | ORAL | 0 refills | 30.00000 days | Status: CP | PRN
Start: 2021-07-15 — End: 2021-08-14

## 2021-08-14 MED ORDER — OXYCODONE ER 60 MG TABLET,CRUSH RESISTANT,EXTENDED RELEASE 12 HR
ORAL_TABLET | Freq: Three times a day (TID) | ORAL | 0 refills | 30.00000 days | Status: CP
Start: 2021-08-14 — End: 2021-09-13

## 2021-08-14 MED ORDER — OXYCODONE 10 MG TABLET
ORAL_TABLET | Freq: Four times a day (QID) | ORAL | 0 refills | 30.00000 days | Status: CP | PRN
Start: 2021-08-14 — End: 2021-09-13

## 2021-09-09 DIAGNOSIS — M961 Postlaminectomy syndrome, not elsewhere classified: Principal | ICD-10-CM

## 2021-09-10 DIAGNOSIS — M961 Postlaminectomy syndrome, not elsewhere classified: Principal | ICD-10-CM

## 2021-09-11 MED ORDER — OXYCODONE 10 MG TABLET
ORAL_TABLET | Freq: Four times a day (QID) | ORAL | 0 refills | 30.00000 days | Status: CP | PRN
Start: 2021-09-11 — End: 2021-10-11
  Filled 2021-09-11: qty 120, 30d supply, fill #0

## 2021-09-11 MED ORDER — OXYCODONE ER 60 MG TABLET,CRUSH RESISTANT,EXTENDED RELEASE 12 HR
ORAL_TABLET | Freq: Three times a day (TID) | ORAL | 0 refills | 30.00000 days | Status: CP
Start: 2021-09-11 — End: 2021-10-11

## 2021-09-11 MED FILL — OXYCONTIN 60 MG TABLET,CRUSH RESISTANT,EXTENDED RELEASE: ORAL | 30 days supply | Qty: 90 | Fill #0

## 2021-09-13 MED ORDER — OXYCODONE ER 60 MG TABLET,CRUSH RESISTANT,EXTENDED RELEASE 12 HR
ORAL_TABLET | Freq: Three times a day (TID) | ORAL | 0 refills | 30.00000 days | Status: CP
Start: 2021-09-13 — End: 2021-10-13

## 2021-09-13 MED ORDER — OXYCODONE 10 MG TABLET
ORAL_TABLET | Freq: Four times a day (QID) | ORAL | 0 refills | 30.00000 days | Status: CP | PRN
Start: 2021-09-13 — End: 2021-10-13

## 2021-09-17 ENCOUNTER — Ambulatory Visit: Admit: 2021-09-17 | Discharge: 2021-09-18 | Payer: MEDICARE

## 2021-09-17 DIAGNOSIS — M961 Postlaminectomy syndrome, not elsewhere classified: Principal | ICD-10-CM

## 2021-09-17 DIAGNOSIS — Z79891 Long term (current) use of opiate analgesic: Principal | ICD-10-CM

## 2021-09-17 DIAGNOSIS — M542 Cervicalgia: Principal | ICD-10-CM

## 2021-09-23 MED ORDER — NALOXONE 4 MG/ACTUATION NASAL SPRAY
0 refills | 0.00000 days | Status: CP
Start: 2021-09-23 — End: ?

## 2021-10-13 MED ORDER — OXYCODONE 10 MG TABLET
ORAL_TABLET | Freq: Four times a day (QID) | ORAL | 0 refills | 30 days | Status: CP | PRN
Start: 2021-10-13 — End: 2021-11-12
  Filled 2021-10-13: qty 90, 30d supply, fill #0
  Filled 2021-10-13: qty 120, 30d supply, fill #0

## 2021-10-13 MED ORDER — OXYCODONE ER 60 MG TABLET,CRUSH RESISTANT,EXTENDED RELEASE 12 HR
ORAL_TABLET | Freq: Three times a day (TID) | ORAL | 0 refills | 30.00000 days | Status: CP
Start: 2021-10-13 — End: 2021-11-12

## 2021-11-12 MED ORDER — OXYCODONE ER 60 MG TABLET,CRUSH RESISTANT,EXTENDED RELEASE 12 HR
ORAL_TABLET | Freq: Three times a day (TID) | ORAL | 0 refills | 30.00000 days | Status: CP
Start: 2021-11-12 — End: 2021-12-12

## 2021-11-12 MED ORDER — OXYCODONE 10 MG TABLET
ORAL_TABLET | Freq: Four times a day (QID) | ORAL | 0 refills | 30 days | Status: CP | PRN
Start: 2021-11-12 — End: 2021-12-12
  Filled 2021-11-12: qty 90, 30d supply, fill #0
  Filled 2021-11-12: qty 120, 30d supply, fill #0

## 2021-12-11 ENCOUNTER — Ambulatory Visit: Admit: 2021-12-11 | Discharge: 2021-12-12 | Payer: MEDICARE

## 2021-12-11 DIAGNOSIS — Z0289 Encounter for other administrative examinations: Principal | ICD-10-CM

## 2021-12-11 DIAGNOSIS — G894 Chronic pain syndrome: Principal | ICD-10-CM

## 2021-12-11 DIAGNOSIS — M961 Postlaminectomy syndrome, not elsewhere classified: Principal | ICD-10-CM

## 2021-12-11 MED FILL — OXYCONTIN 60 MG TABLET,CRUSH RESISTANT,EXTENDED RELEASE: ORAL | 30 days supply | Qty: 90 | Fill #0

## 2021-12-12 MED ORDER — OXYCODONE 10 MG TABLET
ORAL_TABLET | Freq: Four times a day (QID) | ORAL | 0 refills | 30 days | Status: CP | PRN
Start: 2021-12-12 — End: 2022-01-11
  Filled 2021-12-11: qty 120, 30d supply, fill #0

## 2021-12-12 MED ORDER — OXYCODONE ER 60 MG TABLET,CRUSH RESISTANT,EXTENDED RELEASE 12 HR
ORAL_TABLET | Freq: Three times a day (TID) | ORAL | 0 refills | 30 days | Status: CP
Start: 2021-12-12 — End: 2022-01-11

## 2022-01-11 MED ORDER — OXYCODONE 10 MG TABLET
ORAL_TABLET | Freq: Four times a day (QID) | ORAL | 0 refills | 30 days | Status: CP | PRN
Start: 2022-01-11 — End: 2022-02-10
  Filled 2022-01-11: qty 120, 30d supply, fill #0

## 2022-01-11 MED ORDER — OXYCODONE ER 60 MG TABLET,CRUSH RESISTANT,EXTENDED RELEASE 12 HR
ORAL_TABLET | Freq: Three times a day (TID) | ORAL | 0 refills | 30 days | Status: CP
Start: 2022-01-11 — End: 2022-02-10

## 2022-01-11 MED FILL — OXYCONTIN 60 MG TABLET,CRUSH RESISTANT,EXTENDED RELEASE: ORAL | 30 days supply | Qty: 90 | Fill #0

## 2022-02-10 MED ORDER — OXYCODONE 10 MG TABLET
ORAL_TABLET | Freq: Four times a day (QID) | ORAL | 0 refills | 30 days | Status: CP | PRN
Start: 2022-02-10 — End: 2022-03-12
  Filled 2022-02-10: qty 120, 30d supply, fill #0

## 2022-02-10 MED ORDER — OXYCODONE ER 60 MG TABLET,CRUSH RESISTANT,EXTENDED RELEASE 12 HR
ORAL_TABLET | Freq: Three times a day (TID) | ORAL | 0 refills | 30 days | Status: CP
Start: 2022-02-10 — End: 2022-03-12

## 2022-02-10 MED FILL — OXYCONTIN 60 MG TABLET,CRUSH RESISTANT,EXTENDED RELEASE: ORAL | 30 days supply | Qty: 90 | Fill #0

## 2022-03-12 ENCOUNTER — Ambulatory Visit: Admit: 2022-03-12 | Discharge: 2022-03-13 | Payer: MEDICARE

## 2022-03-12 DIAGNOSIS — Z79891 Long term (current) use of opiate analgesic: Principal | ICD-10-CM

## 2022-03-12 DIAGNOSIS — M542 Cervicalgia: Principal | ICD-10-CM

## 2022-03-12 DIAGNOSIS — M961 Postlaminectomy syndrome, not elsewhere classified: Principal | ICD-10-CM

## 2022-03-12 MED ORDER — OXYCODONE 10 MG TABLET
ORAL_TABLET | Freq: Four times a day (QID) | ORAL | 0 refills | 30.00000 days | Status: CP | PRN
Start: 2022-03-12 — End: 2022-04-11
  Filled 2022-03-12: qty 120, 30d supply, fill #0
  Filled 2022-03-12: qty 90, 30d supply, fill #0

## 2022-03-12 MED ORDER — OXYCODONE ER 60 MG TABLET,CRUSH RESISTANT,EXTENDED RELEASE 12 HR
ORAL_TABLET | Freq: Three times a day (TID) | ORAL | 0 refills | 30.00000 days | Status: CP
Start: 2022-03-12 — End: 2022-04-11

## 2022-03-12 MED ORDER — NALOXONE 4 MG/ACTUATION NASAL SPRAY
0 refills | 0 days | Status: CP
Start: 2022-03-12 — End: ?
  Filled 2022-03-12: qty 2, 1d supply, fill #0

## 2022-04-09 MED ORDER — OXYCODONE ER 60 MG TABLET,CRUSH RESISTANT,EXTENDED RELEASE 12 HR
ORAL_TABLET | Freq: Three times a day (TID) | ORAL | 0 refills | 30 days | Status: CP
Start: 2022-04-09 — End: 2022-05-09

## 2022-04-09 MED ORDER — OXYCODONE 10 MG TABLET
ORAL_TABLET | Freq: Four times a day (QID) | ORAL | 0 refills | 30 days | Status: CP | PRN
Start: 2022-04-09 — End: 2022-05-09
  Filled 2022-04-09: qty 120, 30d supply, fill #0

## 2022-04-09 MED FILL — OXYCONTIN 60 MG TABLET,CRUSH RESISTANT,EXTENDED RELEASE: ORAL | 30 days supply | Qty: 90 | Fill #0

## 2022-05-11 MED ORDER — OXYCODONE ER 60 MG TABLET,CRUSH RESISTANT,EXTENDED RELEASE 12 HR
ORAL_TABLET | Freq: Three times a day (TID) | ORAL | 0 refills | 30 days | Status: CP
Start: 2022-05-11 — End: 2022-06-10

## 2022-05-11 MED ORDER — OXYCODONE 10 MG TABLET
ORAL_TABLET | Freq: Four times a day (QID) | ORAL | 0 refills | 30 days | Status: CP | PRN
Start: 2022-05-11 — End: 2022-06-10
  Filled 2022-05-11: qty 90, 30d supply, fill #0
  Filled 2022-05-11: qty 120, 30d supply, fill #0

## 2022-06-09 ENCOUNTER — Ambulatory Visit: Admit: 2022-06-09 | Discharge: 2022-06-10 | Payer: MEDICARE

## 2022-06-09 DIAGNOSIS — M961 Postlaminectomy syndrome, not elsewhere classified: Principal | ICD-10-CM

## 2022-06-09 DIAGNOSIS — G894 Chronic pain syndrome: Principal | ICD-10-CM

## 2022-06-09 DIAGNOSIS — Z0289 Encounter for other administrative examinations: Principal | ICD-10-CM

## 2022-06-09 DIAGNOSIS — M5416 Radiculopathy, lumbar region: Principal | ICD-10-CM

## 2022-06-09 MED ORDER — OXYCODONE ER 60 MG TABLET,CRUSH RESISTANT,EXTENDED RELEASE 12 HR
ORAL_TABLET | Freq: Three times a day (TID) | ORAL | 0 refills | 30 days | Status: CP
Start: 2022-06-09 — End: 2022-07-09

## 2022-06-09 MED ORDER — OXYCODONE 10 MG TABLET
ORAL_TABLET | Freq: Four times a day (QID) | ORAL | 0 refills | 30 days | Status: CP | PRN
Start: 2022-06-09 — End: 2022-07-09

## 2022-06-09 MED FILL — OXYCONTIN 60 MG TABLET,CRUSH RESISTANT,EXTENDED RELEASE: ORAL | 30 days supply | Qty: 90 | Fill #0

## 2022-06-10 MED ORDER — OXYCODONE ER 60 MG TABLET,CRUSH RESISTANT,EXTENDED RELEASE 12 HR
ORAL_TABLET | Freq: Three times a day (TID) | ORAL | 0 refills | 30 days | Status: CP
Start: 2022-06-10 — End: 2022-03-12

## 2022-06-10 MED ORDER — OXYCODONE 10 MG TABLET
ORAL_TABLET | Freq: Four times a day (QID) | ORAL | 0 refills | 30 days | Status: CP | PRN
Start: 2022-06-10 — End: 2022-03-12
  Filled 2022-06-09: qty 120, 30d supply, fill #0

## 2022-07-09 MED ORDER — OXYCODONE 10 MG TABLET
ORAL_TABLET | Freq: Four times a day (QID) | ORAL | 0 refills | 30 days | Status: CP | PRN
Start: 2022-07-09 — End: 2022-08-08
  Filled 2022-07-09: qty 120, 30d supply, fill #0

## 2022-07-09 MED ORDER — OXYCODONE ER 60 MG TABLET,CRUSH RESISTANT,EXTENDED RELEASE 12 HR
ORAL_TABLET | Freq: Three times a day (TID) | ORAL | 0 refills | 30 days | Status: CP
Start: 2022-07-09 — End: ?
  Filled 2022-07-09: qty 90, 30d supply, fill #0

## 2022-07-19 DIAGNOSIS — M961 Postlaminectomy syndrome, not elsewhere classified: Principal | ICD-10-CM

## 2022-08-04 MED ORDER — OXYCODONE ER 60 MG TABLET,CRUSH RESISTANT,EXTENDED RELEASE 12 HR
ORAL_TABLET | Freq: Three times a day (TID) | ORAL | 0 refills | 30 days | Status: CP
Start: 2022-08-04 — End: ?

## 2022-08-04 MED ORDER — OXYCODONE 10 MG TABLET
ORAL_TABLET | Freq: Four times a day (QID) | ORAL | 0 refills | 30 days | Status: CP | PRN
Start: 2022-08-04 — End: 2022-09-03
  Filled 2022-08-05: qty 120, 30d supply, fill #0

## 2022-08-05 MED FILL — OXYCONTIN 60 MG TABLET,CRUSH RESISTANT,EXTENDED RELEASE: ORAL | 30 days supply | Qty: 90 | Fill #0

## 2022-08-08 MED ORDER — OXYCODONE ER 60 MG TABLET,CRUSH RESISTANT,EXTENDED RELEASE 12 HR
ORAL_TABLET | Freq: Three times a day (TID) | ORAL | 0 refills | 30 days | Status: CP
Start: 2022-08-08 — End: ?

## 2022-08-08 MED ORDER — OXYCODONE 10 MG TABLET
ORAL_TABLET | Freq: Four times a day (QID) | ORAL | 0 refills | 30 days | Status: CP | PRN
Start: 2022-08-08 — End: 2022-09-07

## 2022-09-01 ENCOUNTER — Ambulatory Visit: Admit: 2022-09-01 | Discharge: 2022-09-01 | Payer: MEDICARE

## 2022-09-01 DIAGNOSIS — G894 Chronic pain syndrome: Principal | ICD-10-CM

## 2022-09-01 DIAGNOSIS — L309 Dermatitis, unspecified: Principal | ICD-10-CM

## 2022-09-01 DIAGNOSIS — M5412 Radiculopathy, cervical region: Principal | ICD-10-CM

## 2022-09-01 DIAGNOSIS — M961 Postlaminectomy syndrome, not elsewhere classified: Principal | ICD-10-CM

## 2022-09-01 MED ORDER — OXYCODONE 10 MG TABLET
ORAL_TABLET | Freq: Four times a day (QID) | ORAL | 0 refills | 30 days | Status: CP | PRN
Start: 2022-09-01 — End: 2022-10-01
  Filled 2022-09-01: qty 120, 30d supply, fill #0

## 2022-09-01 MED ORDER — OXYCODONE ER 60 MG TABLET,CRUSH RESISTANT,EXTENDED RELEASE 12 HR
ORAL_TABLET | Freq: Three times a day (TID) | ORAL | 0 refills | 30 days | Status: CP
Start: 2022-09-01 — End: ?
  Filled 2022-09-01: qty 90, 30d supply, fill #0

## 2022-09-01 MED ORDER — HYDROXYZINE HCL 25 MG TABLET
ORAL_TABLET | Freq: Two times a day (BID) | ORAL | 0 refills | 30 days | Status: CP | PRN
Start: 2022-09-01 — End: ?
  Filled 2022-09-01: qty 60, 30d supply, fill #0

## 2022-09-01 MED ORDER — CLOBETASOL 0.05 % TOPICAL OINTMENT
Freq: Two times a day (BID) | TOPICAL | 2 refills | 0 days | Status: CP
Start: 2022-09-01 — End: ?
  Filled 2022-09-01: qty 30, 30d supply, fill #0

## 2022-10-07 ENCOUNTER — Ambulatory Visit: Admit: 2022-10-07 | Discharge: 2022-10-07 | Payer: MEDICARE

## 2022-10-07 DIAGNOSIS — Z1159 Encounter for screening for other viral diseases: Principal | ICD-10-CM

## 2022-10-07 DIAGNOSIS — M542 Cervicalgia: Principal | ICD-10-CM

## 2022-10-07 DIAGNOSIS — L401 Generalized pustular psoriasis: Principal | ICD-10-CM

## 2022-10-07 DIAGNOSIS — Z79891 Long term (current) use of opiate analgesic: Principal | ICD-10-CM

## 2022-10-07 DIAGNOSIS — L309 Dermatitis, unspecified: Principal | ICD-10-CM

## 2022-10-07 DIAGNOSIS — L403 Pustulosis palmaris et plantaris: Principal | ICD-10-CM

## 2022-10-07 DIAGNOSIS — Z79899 Other long term (current) drug therapy: Principal | ICD-10-CM

## 2022-10-07 MED ORDER — OXYCODONE ER 60 MG TABLET,CRUSH RESISTANT,EXTENDED RELEASE 12 HR
ORAL_TABLET | Freq: Three times a day (TID) | ORAL | 0 refills | 30 days | Status: CP
Start: 2022-10-07 — End: ?

## 2022-10-07 MED ORDER — OXYCODONE 10 MG TABLET
ORAL_TABLET | Freq: Four times a day (QID) | ORAL | 0 refills | 30 days | Status: CP | PRN
Start: 2022-10-07 — End: 2022-11-06
  Filled 2022-10-07: qty 120, 30d supply, fill #0

## 2022-10-07 MED FILL — OXYCONTIN 60 MG TABLET,CRUSH RESISTANT,EXTENDED RELEASE: ORAL | 30 days supply | Qty: 90 | Fill #0

## 2022-10-15 DIAGNOSIS — L409 Psoriasis, unspecified: Principal | ICD-10-CM

## 2022-10-15 MED ORDER — OTEZLA 30 MG TABLET
ORAL_TABLET | ORAL | 11 refills | 0.00000 days | Status: CP
Start: 2022-10-15 — End: ?
  Filled 2022-10-27: qty 55, 28d supply, fill #0

## 2022-10-15 MED ORDER — OTEZLA STARTER 10 MG (4)-20 MG (4)-30 MG(47) TABLETS IN A DOSE PACK
ORAL_TABLET | ORAL | 0 refills | 0.00000 days | Status: CP
Start: 2022-10-15 — End: ?

## 2022-10-20 DIAGNOSIS — L409 Psoriasis, unspecified: Principal | ICD-10-CM

## 2022-10-25 NOTE — Unmapped (Signed)
[facility] Specialty Medication Onboarding    Specialty Medication: OTEZLA   Prior Authorization: Approved   Financial Assistance: No - copay  <$25  Final Copay/Day Supply: $0 / 14 days (LD)          $0 / 30 days (MD)    Insurance Restrictions: None     Notes to Pharmacist:     The triage team has completed the benefits investigation and has determined that the patient is able to fill this medication at [facility]. Please contact the patient to complete the onboarding or follow up with the prescribing physician as needed.

## 2022-10-26 NOTE — Unmapped (Signed)
[facility]   Patient Onboarding/Medication Counseling    Carla Evans is a 62 y.o. female with psoriasis who I am counseling today on initiation of therapy.  I am speaking to the patient.    Was a Nurse, learning disability used for this call? No    Verified patient's date of birth / HIPAA.    Specialty medication(s) to be sent: Inflammatory Disorders: Otezla      Non-specialty medications/supplies to be sent: NA      Medications not needed at this time: NA         Otezla (apremilast)    Medication & Administration     Dosage: Psoriasis: Take 10mg  by mouth in the morning on day 1, 10mg  twice daily on day 2, 10mg  in the morning and 20mg  in the evening on day 3, 20mg  twice daily on day 4, 20mg  in the morning and 30mg  in the evening on day 5, then 30mg  twice daily thereafter    Administration: Take tablets by mouth.  Swallow whole - do not chew, break, or crush.    Adherence/Missed dose instructions:  Take a missed dose as soon as you think about it if it's within 6 hours of your normal dosing time.  Never take two doses at the same time to try and catch up after a missed dose.    Goals of Therapy     Beh??et disease  Suppress inflammatory exacerbations and recurrences  Prevent irreversible organ damage  Achieve low disease activity  Maintenance of effective psychosocial functioning    Plaque Psoriasis  Minimize areas of skin involvement (% BSA)  Avoidance of long term glucocorticoid use  Maintenance of effective psychosocial functioning    Psoriatic arthritis  Achieve remission/inactive disease or low/minimal disease activity  Maintenance of function  Minimization of systemic manifestations and comorbidities  Maintenance of effective psychosocial functioning      Side Effects & Monitoring Parameters     Upset stomach and/or diarrhea  Headache  Weight loss    The following side effects should be reported to the provider:  Signs of a hypersensitivity reaction - rash; hives; itching; red, swollen, blistered, or peeling skin; wheezing; tightness in the chest or throat; difficulty breathing, swallowing, or talking; swelling of the mouth, face, lips, tongue, or throat; etc.  Excessive weight loss  Changes in mood (depression) or behavior      Contraindications, Warnings, & Precautions     Have your bloodwork checked as you have been told by your prescriber  Talk with your doctor if you are pregnant, planning to become pregnant, or breastfeeding      Drug/Food Interactions     Medication list reviewed in Epic. The patient was instructed to inform the care team before taking any new medications or supplements. No drug interactions identified.   Apremilast is a major substrate for CYP3A4 - consult care team before starting any new medication (Rx or OTC) or supplements    Storage, Handling Precautions, & Disposal     Store at room temperature  Protect from moisture        Current Medications (including OTC/herbals), Comorbidities and Allergies     Current Outpatient Medications   Medication Sig Dispense Refill    albuterol (PROVENTIL HFA;VENTOLIN HFA) 90 mcg/actuation inhaler Inhale 2 puffs daily as needed.      apremilast (OTEZLA STARTER) 10 mg (4)-20 mg (4)-30 mg (47) DsPk Take 10mg  once on day 1 (d1), 10mg  2x/day (d2), 10mg  in AM and 20mg  in PM (d3),  20mg  2x/day (d4), 20mg  in AM and 30mg  in PM (d5), and 30mg  2x/day after that 55 tablet 0    apremilast (OTEZLA) 30 mg Tab Take 1 tablet by mouth twice a day  to treat rash on hands 60 tablet 11    cetirizine (ZYRTEC) 10 MG tablet Take 1 tablet (10 mg total) by mouth daily.      clobetasol (TEMOVATE) 0.05 % ointment Apply topically two (2) times a day. 30 g 2    cyanocobalamin, vitamin B-12, 1,000 mcg/mL injection Frequency:Q2W   Dosage:1   ML  Instructions:  Note:Dose:      fluticasone propionate (FLONASE) 50 mcg/actuation nasal spray 1 spray into each nostril daily.      fluticasone propionate (FLOVENT HFA) 220 mcg/actuation inhaler Inhale 2 puffs daily as needed. Frequency:BID   Dosage:220   MCG  Instructions:  Note:Dose:      hydrOXYzine (ATARAX) 25 MG tablet Take 1 tablet (25 mg total) by mouth two (2) times a day as needed. 60 tablet 0    ibuprofen (ADVIL,MOTRIN) 200 MG tablet Take 1 tablet (200 mg total) by mouth every six (6) hours as needed for pain. 2 tabs as needed      lubiprostone (AMITIZA) 24 MCG capsule Take 1 capsule (24 mcg total) by mouth daily as needed. 90 capsule 0    naloxone (NARCAN) 4 mg nasal spray One spray in either nostril once for known/suspected opioid overdose. May repeat every 2-3 minutes in alternating nostril til EMS arrives 2 each 0    oxyCODONE (OXYCONTIN) 60 mg 12 hr crush resistant ER/CR tablet Take 1 tablet (60 mg total) by mouth every eight (8) hours. OK to fill 09/01/22 90 tablet 0    oxyCODONE (OXYCONTIN) 60 mg 12 hr crush resistant ER/CR tablet Take 1 tablet (60 mg total) by mouth every eight (8) hours. DNF: 10/07/22 90 tablet 0    [START ON 11/05/2022] oxyCODONE (OXYCONTIN) 60 mg 12 hr crush resistant ER/CR tablet Take 1 tablet (60 mg total) by mouth every eight (8) hours. DNF: 11/05/22 90 tablet 0    oxyCODONE (ROXICODONE) 10 mg immediate release tablet Take 1 tablet (10 mg total) by mouth every six (6) hours as needed for pain. DNF: 10/07/22 120 tablet 0    [START ON 11/05/2022] oxyCODONE (ROXICODONE) 10 mg immediate release tablet Take 1 tablet (10 mg total) by mouth every six (6) hours as needed for pain. DNF: 11/05/22 120 tablet 0    pantoprazole (PROTONIX) 40 MG tablet Take 1 tablet (40 mg total) by mouth daily.      promethazine (PHENERGAN) 25 MG tablet Take 1 tablet (25 mg total) by mouth once as needed.      salmeterol (SEREVENT) 50 mcg/dose diskus inhaler Inhale 1 puff.      tiotropium (SPIRIVA) 18 mcg inhalation capsule Place 1 capsule (18 mcg total) into inhaler and inhale.      tiZANidine (ZANAFLEX) 4 MG tablet Take 1 tablet (4 mg total) by mouth every eight (8) hours as needed. 90 tablet 2     No current facility-administered medications for this visit.       Allergies   Allergen Reactions    Celebrex [Celecoxib] Swelling    Latex Shortness Of Breath and Rash    Latex, Natural Rubber Shortness Of Breath and Rash    Vioxx [Rofecoxib] Swelling     Severe facial swelling. my face swelled up like a basketball    Xtampza Er [Oxycodone Myristate] Swelling  throat started to close burned neck    Augmentin [Amoxicillin-Pot Clavulanate] Rash    Other      Due to eczema on hands, unable to touch raw fruits and vegetables and most perfume soaps and lotions. Causes blisters, breaking of skin, and bleeding.   Pt reports she can eat raw friut and vegetables, just can't touch them with her hands.    Adhesive Rash       Patient Active Problem List   Diagnosis    Long term prescription opiate use    Sciatica of right side    Lumbar post-laminectomy syndrome    Cervicalgia    Chronic nonintractable headache    Lumbar canal stenosis    Mixed stress and urge urinary incontinence    Pain medication agreement signed       Reviewed and up to date in Epic.    Appropriateness of Therapy     Acute infections noted within Epic:  No active infections  Patient reported infection: None    Is medication and dose appropriate based on diagnosis and infection status? Yes    Prescription has been clinically reviewed: Yes      Baseline Quality of Life Assessment      How many days over the past month did your psoriasis  keep you from your normal activities? For example, brushing your teeth or getting up in the morning. Patient declined to answer    Financial Information     Medication Assistance provided: Prior Authorization    Anticipated copay of $0 reviewed with patient. Verified delivery address.    Delivery Information     Scheduled delivery date: 2/15    Expected start date: 2/15    Patient was notified of new phone menu: No    Medication will be delivered via UPS to the prescription address in Epic [place].  This shipment will not require a signature.      Explained the services we provide at [facility] and that each month we would call to set up refills.  Stressed importance of returning phone calls so that we could ensure they receive their medications in time each month.  Informed patient that we should be setting up refills 7-10 days prior to when they will run out of medication.  A pharmacist will reach out to perform a clinical assessment periodically.  Informed patient that a welcome packet, containing information about our pharmacy and other support services, a Notice of Privacy Practices, and a drug information handout will be sent.      The patient or caregiver noted above participated in the development of this care plan and knows that they can request review of or adjustments to the care plan at any time.      Patient or caregiver verbalized understanding of the above information as well as how to contact the pharmacy at [phone] option [phone] with any questions/concerns.  The pharmacy is open Monday through Friday 8:30am-4:30pm.  A pharmacist is available 24/7 via pager to answer any clinical questions they may have.    Patient Specific Needs     Does the patient have any physical, cognitive, or cultural barriers? No    Does the patient have adequate living arrangements? (i.e. the ability to store and take their medication appropriately) Yes    Did you identify any home environmental safety or security hazards? No    Patient prefers to have medications discussed with  Patient     Is the patient  or caregiver able to read and understand education materials at a high school level or above? Yes    Patient's primary language is  English     Is the patient high risk? No    SOCIAL DETERMINANTS OF HEALTH     At the [facility], we have learned that life circumstances - like trouble affording food, housing, utilities, or transportation can affect the health of many of our patients.   That is why we wanted to ask: are you currently experiencing any life circumstances that are negatively impacting your health and/or quality of life? Patient declined to answer    Social Determinants of Health     Financial Resource Strain: Not on file   Internet Connectivity: Not on file   Food Insecurity: Not on file   Tobacco Use: High Risk (10/07/2022)    Patient History     Smoking Tobacco Use: Every Day     Smokeless Tobacco Use: Never     Passive Exposure: Not on file   Housing/Utilities: Not on file   Alcohol Use: Not on file   Transportation Needs: Not on file   Substance Use: Not on file   Health Literacy: Not on file   Physical Activity: Not on file   Interpersonal Safety: Not on file   Stress: Not on file   Intimate Partner Violence: Not on file   Depression: Not on file   Social Connections: Not on file       Would you be willing to receive help with any of the needs that you have identified today? Not applicable       Lyn Deemer A Desiree Lucy [facility] Specialty Pharmacist

## 2022-11-02 MED ORDER — TIZANIDINE 4 MG TABLET
ORAL_TABLET | Freq: Three times a day (TID) | ORAL | 2 refills | 30 days | PRN
Start: 2022-11-02 — End: ?

## 2022-11-02 NOTE — Unmapped (Signed)
Refill request received for patient.      Medication Requested: Zanaflex   Last Office Visit: 09/01/2022   Next Office Visit: 12/01/2022  Last Prescriber: Galen Daft    Please refill if appropriate

## 2022-11-03 MED ORDER — TIZANIDINE 4 MG TABLET
ORAL_TABLET | Freq: Three times a day (TID) | ORAL | 2 refills | 30 days | Status: CP | PRN
Start: 2022-11-03 — End: ?
  Filled 2022-11-05: qty 90, 30d supply, fill #0

## 2022-11-05 MED ORDER — OXYCODONE ER 60 MG TABLET,CRUSH RESISTANT,EXTENDED RELEASE 12 HR
ORAL_TABLET | Freq: Three times a day (TID) | ORAL | 0 refills | 30 days | Status: CP
Start: 2022-11-05 — End: ?

## 2022-11-05 MED ORDER — OXYCODONE 10 MG TABLET
ORAL_TABLET | Freq: Four times a day (QID) | ORAL | 0 refills | 30 days | Status: CP | PRN
Start: 2022-11-05 — End: 2022-12-05
  Filled 2022-11-05: qty 120, 30d supply, fill #0

## 2022-11-05 MED FILL — OXYCONTIN 60 MG TABLET,CRUSH RESISTANT,EXTENDED RELEASE: ORAL | 30 days supply | Qty: 90 | Fill #0

## 2022-11-25 NOTE — Unmapped (Signed)
[name] started her Carla Evans about a week ago - she is tolerating OK from a GI perspective but has noticed a lot of bad headaches. She's treating with ibuprofen. We reviewed she could pre-medicate with ibuprofen since it seems to happen an hour after her dose, but to report worsening/inability to complete ADLs.     She hasn't noticed any changes with her skin yet but understands it will take more time.     [facility] Clinical Assessment & Refill Coordination Note    Carla Evans, DOB: August 01, 1961  Phone: [phone] (home) [phone] (work)    All above HIPAA information was verified with patient.     Was a Nurse, learning disability used for this call? No    Specialty Medication(s):   Inflammatory Disorders: Carla Evans     Current Outpatient Medications   Medication Sig Dispense Refill    albuterol (PROVENTIL HFA;VENTOLIN HFA) 90 mcg/actuation inhaler Inhale 2 puffs daily as needed.      apremilast (OTEZLA STARTER) 10 mg (4)-20 mg (4)-30 mg (47) DsPk Take 10mg  once on day 1 (d1), 10mg  2x/day (d2), 10mg  in AM and 20mg  in PM (d3), 20mg  2x/day (d4), 20mg  in AM and 30mg  in PM (d5), and 30mg  2x/day after that 55 tablet 0    apremilast (OTEZLA) 30 mg Tab Take 1 tablet by mouth twice a day to treat rash on hands 60 tablet 11    cetirizine (ZYRTEC) 10 MG tablet Take 1 tablet (10 mg total) by mouth daily.      clobetasol (TEMOVATE) 0.05 % ointment Apply topically two (2) times a day. 30 g 2    cyanocobalamin, vitamin B-12, 1,000 mcg/mL injection Frequency:Q2W   Dosage:1   ML  Instructions:  Note:Dose:      fluticasone propionate (FLONASE) 50 mcg/actuation nasal spray 1 spray into each nostril daily.      fluticasone propionate (FLOVENT HFA) 220 mcg/actuation inhaler Inhale 2 puffs daily as needed. Frequency:BID   Dosage:220   MCG  Instructions:  Note:Dose:      hydrOXYzine (ATARAX) 25 MG tablet Take 1 tablet (25 mg total) by mouth two (2) times a day as needed. 60 tablet 0    ibuprofen (ADVIL,MOTRIN) 200 MG tablet Take 1 tablet (200 mg total) by mouth every six (6) hours as needed for pain. 2 tabs as needed      lubiprostone (AMITIZA) 24 MCG capsule Take 1 capsule (24 mcg total) by mouth daily as needed. 90 capsule 0    naloxone (NARCAN) 4 mg nasal spray One spray in either nostril once for known/suspected opioid overdose. May repeat every 2-3 minutes in alternating nostril til EMS arrives 2 each 0    oxyCODONE (OXYCONTIN) 60 mg 12 hr crush resistant ER/CR tablet Take 1 tablet (60 mg total) by mouth every eight (8) hours. OK to fill 09/01/22 90 tablet 0    oxyCODONE (OXYCONTIN) 60 mg 12 hr crush resistant ER/CR tablet Take 1 tablet (60 mg total) by mouth every eight (8) hours. DNF: 10/07/22 90 tablet 0    oxyCODONE (OXYCONTIN) 60 mg 12 hr crush resistant ER/CR tablet Take 1 tablet (60 mg total) by mouth every eight (8) hours. 90 tablet 0    oxyCODONE (ROXICODONE) 10 mg immediate release tablet Take 1 tablet (10 mg total) by mouth every six (6) hours as needed for pain. 120 tablet 0    pantoprazole (PROTONIX) 40 MG tablet Take 1 tablet (40 mg total) by mouth daily.  promethazine (PHENERGAN) 25 MG tablet Take 1 tablet (25 mg total) by mouth once as needed.      salmeterol (SEREVENT) 50 mcg/dose diskus inhaler Inhale 1 puff.      tiotropium (SPIRIVA) 18 mcg inhalation capsule Place 1 capsule (18 mcg total) into inhaler and inhale.      tizanidine (ZANAFLEX) 4 MG tablet Take 1 tablet (4 mg total) by mouth every eight (8) hours as needed. 90 tablet 2     No current facility-administered medications for this visit.        Changes to medications: Lennyx reports no changes at this time.    Allergies   Allergen Reactions    Celebrex [Celecoxib] Swelling    Latex Shortness Of Breath and Rash    Latex, Natural Rubber Shortness Of Breath and Rash    Vioxx [Rofecoxib] Swelling     Severe facial swelling. my face swelled up like a basketball    Xtampza Er [Oxycodone Myristate] Swelling     throat started to close burned neck    Augmentin [Amoxicillin-Pot Clavulanate] Rash    Other      Due to eczema on hands, unable to touch raw fruits and vegetables and most perfume soaps and lotions. Causes blisters, breaking of skin, and bleeding.   Pt reports she can eat raw friut and vegetables, just can't touch them with her hands.    Adhesive Rash       Changes to allergies: No    SPECIALTY MEDICATION ADHERENCE     Otezla - 21 days left  Medication Adherence    Patient reported X missed doses in the last month: 0  Specialty Medication: Otezla          Specialty medication(s) dose(s) confirmed: Regimen is correct and unchanged.     Are there any concerns with adherence? No    Adherence counseling provided? Not needed    CLINICAL MANAGEMENT AND INTERVENTION      Clinical Benefit Assessment:    Do you feel the medicine is effective or helping your condition? No    Clinical Benefit counseling provided? Reasonable expectations discussed: just started 1 week ago -likely will take 8-12 weeks for effect.    Adverse Effects Assessment:    Are you experiencing any side effects? Yes, patient reports experiencing headaches. Side effect counseling provided: she is using ibuprofen which helps some. She reports onset ~ 1 hour after each dose. Recommended trialing pre-med with ibuprofen to see if this helps.     Are you experiencing difficulty administering your medicine? No    Quality of Life Assessment:    Quality of Life    Rheumatology  Oncology  Dermatology  1. What impact has your specialty medication had on the symptoms of your skin condition (i.e. itchiness, soreness, stinging)?: None  2. What impact has your specialty medication had on your comfort level with your skin?: None  Cystic Fibrosis          How many days over the past month did your rash  keep you from your normal activities? For example, brushing your teeth or getting up in the morning. Patient declined to answer    Have you discussed this with your provider? Not needed    Acute Infection Status:    Acute infections noted within Epic:  No active infections  Patient reported infection: None    Therapy Appropriateness:    Is therapy appropriate and patient progressing towards therapeutic goals? Yes, therapy is appropriate and should be  continued    DISEASE/MEDICATION-SPECIFIC INFORMATION      N/A    Chronic Inflammatory Diseases: Have you experienced any flares in the last month? No  Has this been reported to your provider? No    PATIENT SPECIFIC NEEDS     Does the patient have any physical, cognitive, or cultural barriers? No    Is the patient high risk? No    Did the patient require a clinical intervention? No    Does the patient require physician intervention or other additional services (i.e., nutrition, smoking cessation, social work)? No    SOCIAL DETERMINANTS OF HEALTH     At the [facility], we have learned that life circumstances - like trouble affording food, housing, utilities, or transportation can affect the health of many of our patients.   That is why we wanted to ask: are you currently experiencing any life circumstances that are negatively impacting your health and/or quality of life? Patient declined to answer    Social Determinants of Health     Financial Resource Strain: Not on file   Internet Connectivity: Not on file   Food Insecurity: Not on file   Tobacco Use: High Risk (10/07/2022)    Patient History     Smoking Tobacco Use: Every Day     Smokeless Tobacco Use: Never     Passive Exposure: Not on file   Housing/Utilities: Not on file   Alcohol Use: Not on file   Transportation Needs: Not on file   Substance Use: Not on file   Health Literacy: Not on file   Physical Activity: Not on file   Interpersonal Safety: Not on file   Stress: Not on file   Intimate Partner Violence: Not on file   Depression: Not on file   Social Connections: Not on file       Would you be willing to receive help with any of the needs that you have identified today? Not applicable SHIPPING     Specialty Medication(s) to be Shipped:   Inflammatory Disorders: Otezla    Other medication(s) to be shipped: No additional medications requested for fill at this time     Changes to insurance: No    Patient was informed of new phone menu: No    Delivery Scheduled: Yes, Expected medication delivery date: 3/28.     Medication will be delivered via UPS to the confirmed prescription address in [facility].    The patient will receive a drug information handout for each medication shipped and additional FDA Medication Guides as required.  Verified that patient has previously received a Conservation officer, historic buildings and a Surveyor, mining.    The patient or caregiver noted above participated in the development of this care plan and knows that they can request review of or adjustments to the care plan at any time.      All of the patient's questions and concerns have been addressed.    Lanney Gins   [facility] Specialty Pharmacist

## 2022-12-01 ENCOUNTER — Ambulatory Visit: Admit: 2022-12-01 | Discharge: 2022-12-02 | Payer: MEDICARE

## 2022-12-01 DIAGNOSIS — Z79891 Long term (current) use of opiate analgesic: Principal | ICD-10-CM

## 2022-12-01 DIAGNOSIS — M961 Postlaminectomy syndrome, not elsewhere classified: Principal | ICD-10-CM

## 2022-12-01 DIAGNOSIS — M542 Cervicalgia: Principal | ICD-10-CM

## 2022-12-01 DIAGNOSIS — L309 Dermatitis, unspecified: Principal | ICD-10-CM

## 2022-12-01 MED ORDER — OXYCODONE ER 60 MG TABLET,CRUSH RESISTANT,EXTENDED RELEASE 12 HR
ORAL_TABLET | Freq: Three times a day (TID) | ORAL | 0 refills | 30 days | Status: CP
Start: 2022-12-01 — End: ?
  Filled 2022-12-01: qty 90, 30d supply, fill #0

## 2022-12-01 MED ORDER — HYDROXYZINE HCL 25 MG TABLET
ORAL_TABLET | Freq: Two times a day (BID) | ORAL | 0 refills | 30.00000 days | PRN
Start: 2022-12-01 — End: ?

## 2022-12-01 MED ORDER — OXYCODONE 10 MG TABLET
ORAL_TABLET | Freq: Four times a day (QID) | ORAL | 0 refills | 30 days | Status: CP | PRN
Start: 2022-12-01 — End: 2022-12-31
  Filled 2022-12-01: qty 120, 30d supply, fill #0

## 2022-12-01 NOTE — Unmapped (Signed)
Chronic Pain Follow Up Note    Assessment and Plan:  Carla Evans is a 62 y.o. being followed Carla [facility] clinic for complaint of chronic lower back pain 2/2 lumbar post-laminectomy syndrome and lumbar spinal canal stenosis Carla L3-L4. We have continued to encourage Carla Evans to see spine surgery as she reports continued lower back and bilateral lower extremity discomfort consistent with neurogenic claudication. Carla Evans also reports persistent right lower extremity weakness. She has previously had injections, including LESI and facet injections, with no benefit (Carla previous pain practice). Evans has a long standing hx of GYN related urinary incontinence. Carla Evans has historically been hesitant about undergoing surgical evaluation though recently more amenable especially now in light of worsening pain and symptoms.     1. Lumbar post-laminectomy syndrome   Evans presents with persistent lumbar radicular symptoms and is concerned for progression of disease. She denies red flag symptoms, but does endorse gait changes and imbalance when walking. MRI lumbar results on 10/07/22 showed presence of severe spinal canal narrowing and moderate bilateral neuroforaminal narrowing Carla Carla L3-L4 level.   - Continue OxyContin as listed below   - Continue Oxycodone as listed below   - Continue Tizanidine 4 mg q8h prn  - Discussed about starting Cymbalta for her symptoms, she would like to see Carla surgeon first before starting any new medications     2. Chronic pain syndrome; Cervical radiculopathy; Eczema of both hands   Cervical MRI on 10/07/22 showing presence of severe spinal canal narrowing and moderate bilateral neuro foraminal narrowing Carla Carla level of C4-C5. Evans continues on OxyContin 60 mg TID and Oxycodone 10 mg q6h prn with benefit. We will continue her opioid medications without changes.   - Referral to orthopedic spine center for evaluation    - Continue Oxycontin 60 mg TID, refilled  - Continue Oxycodone 10 mg q6h prn, refilled,  - Continue Amitiza prn   - UDS/treatment agreement up to date     Carla Evans has an active treatment agreement on file, Carla Evans's Evans urine toxicology screen was appropriate and it was within Carla Evans 12 months, Evans brought medications available for pill counts to today's visit and Carla pill counts were appropriate, Carla NCCS[place] database was reviewed and is appropriate, Carla Evans denies any misuse abuse or diversion of medication and reports an medication significantly improve Evans's quality of life as well as functionality status. Carla Evans denies any medication associated side effects and wishes to continue on medications as currently prescribed. Risks and benefits of opiate medications were once again discussed with Evans and Evans describes her understanding.    I have reviewed Carla [facility] Medical Board statement on use of controlled substances for Carla treatment of pain as well as Carla CDC Guideline for Prescribing Opioids for Chronic Pain. I have reviewed Carla [place] Controlled Substance Monitoring Database.    Medication Monitoring:   PDMP reviewed: 12/01/22  Evans urine toxicology: 06/09/22 - appropriate, sample given 3/20  Evans narcotic agreement: 12/01/22  Previous Compliance Issues: Did not bring Oxycodone for pill count 04/03/18, UDS inappropriately positive for hydrocodone on 02/21/20   Naloxone ordered: Yes  Total morphine equivalents: > 300  Benzodiazepine: No  ORT: 0 on 04/03/18    No follow-ups on file.     Future Considerations  -Neurosurgery/Ortho Spine Referral    Requested Prescriptions      No prescriptions requested or ordered in this encounter     No orders of Carla defined  types were placed in this encounter.    HPI:  Carla Evans is a 62 y.o. being followed Carla [name] clinic for complaint of chronic lower back pain 2/2 lumbar post-laminectomy syndrome.     Carla Evans visit in December, Carla Evans presented with worsening lumbar radicular symptoms and was concerned for progression of disease. She denied red flag symptoms, but did endorse gait changes and imbalance when walking. Will reevaluate after we receive MRI Lumbar Spine results. We encouraged Carla Evans previously to consider following with Pain Psychology given multiple life stressors. Evans continued on OxyContin 60 mg TID and Oxycodone 10 mg q6h prn with benefit. We continued her opioid medications without changes. She inquired about MRI Cervical Spine imaging given ongoing numbness and tingling in bilateral arms with new onset dropping of items and grip difficulties.      Since Evans visit, Carla Evans has followed with Derm. She obtained updated MRI Cervical and Lumbar spine imaging.     Today,   Evans reports that her mobility has been an issue. She has been experiencing bilateral lower extremity radicular pain starting from her lumbar region. She has noticed foot weakness on Carla right side where her foot will occasionally catch when she is walking causing her trip and fall. She reports that she has fallen roughly 4 times in Carla Evans six months. She has a lot of numbness in Carla right foot that she believes is contributing to Carla falls. She does not lose consciousness or experience light headiness or dizziness before she falls. She has not experience any sensation changes in Carla lower extremities. Reports that she is finding relief with her pain mediations and not experiencing any side effects. She has not seen her orthopedic surgeon Carla [facility] since early 2000s. She has not done any recent PT.       Current medication regimen:  - Oxycontin 60 mg TID  - Oxycodone 10 mg q6h PRN (4x daily)  - Tizanidine 4 mg q8h prn   - Amitiza every day - occasionally     Current view: Showing all answers           [name] Return Evans Questionnaire       Question 11/26/2022  2:49 AM EDT - Ceasar Mons by Evans    What is Carla reason for your visit? Medication Refill     Review test results    Date of onset of your pain: 08/01/1998    Please rate your pain Carla its WORST in Carla past month. 9    Please rate your pain Carla its LEAST in Carla past month. 7    Please rate your pain as it is RIGHT NOW. 8    Please rate your pain on AVERAGE in Carla past month. 8    Please circle Carla location of your pain.       Please select Carla words that describe your pain. Aching     Burning     Pressing     Sharp     shooting     Stabbing Tender Throbbing    How often do you have pain? All Carla time    When is your pain Carla worst? Mornings    Which of Carla following have been negatively affected by your pain? Enjoyment of life     General activity     Mood     Recreational activities     Sleep     Walking  Sitting     Standing    Since your Evans visit:     Have you had any of Carla following?       Do you have any new pain you would like to discuss with your doctor? No    How has your pain changed? Worse    Are you currently taking any blood-thinners or anticoagulants? No    If you are on Pain Medication - Are you having any of Carla following? Dry mouth     Agitation     My medications help to improve my quality of live    If you have had a procedure since your Evans visit, how much pain relief was obtained?       If you have had a procedure since your Evans visit, were there any complications?       General: Weight Loss/Gain     Night Sweats     Fatigue     Difficulty Sleeping    Cardiovascular: Shortness of Breath with Exertion    Gastrointestinal - (Intestinal): Heartburn     Gastric Reflux    Skin: Rash     Skin changes     Redness     Itching     Dryness    Endocrine (Hormonal System): Increased Sweating     Excess Thirst     Hot Flashes     Excess Urination     Change in hair or skin texture     Thinning hair     Intolerance to heat or cold     Decreased sex drive     Decreased sexual performance     Difficulties with Orgasm (Delayed or None)    Musculoskeletal System - (Muscles, Joints and Coverings): Spasms/Spasticity/Cramps     Back pain     Muscle Aches/Weakness    Neurologic: Coordination Difficulty     Headaches/Migraines     Numbness/Tingling    Psychiatric: Anxiety     Low Concentration     Low Energy     Lack of interest in activities     Feeling Hopeless/Helpless          Mychart Evans-Entered Hpi Selection Questionnaire       Question 11/26/2022  2:58 AM EDT - Ceasar Mons by Evans    What is Carla primary reason for your visit? Back Pain    Your back pain is a... chronic problem    When did you first notice your back pain? More than 1 year ago    How often do you feel back pain? Constantly    Since you first noticed your back pain, how has it changed? Rapidly worsening    Where is your back pain located? Lower back - above Carla waist     Lower back - below Carla waist     Upper back    How would you describe your back pain? Aching     Burning     Shooting     Stabbing    Where does your back pain spread? Left foot     Left thigh     Right foot     Right thigh    On a scale of 0 to 10 (10 being Carla worst), how strong is your back pain? 10    Your back pain is... Carla same all Carla time    What makes your back pain worse? Bending     Coughing     Certain positions  Lying down     Sitting     Standing     Stress     Twisting    When do you feel stiffness in your back? In Carla morning    Are you experiencing any of Carla following symptoms with your back pain?     Abdominal pain No    Bladder incontinence Yes    Bowel incontinence No    Chest pain Yes    Painful urination No    Fever No    Headaches Yes    Leg pain Yes    Numbness Yes    Paralysis No    Pins and needles Yes    Pelvic pain No    Numbness around Carla anus No    Tingling Yes    Weakness Yes    Weight loss Yes    Do any of Carla following apply to you? Lack of exercise     Menopause     Poor posture     Inactive lifestyle            Evans denies homicidal/suicidal ideation.    Allergies  Allergies   Allergen Reactions    Celebrex [Celecoxib] Swelling Latex Shortness Of Breath and Rash    Latex, Natural Rubber Shortness Of Breath and Rash    Vioxx [Rofecoxib] Swelling     Severe facial swelling. my face swelled up like a basketball    Xtampza Er [Oxycodone Myristate] Swelling     throat started to close burned neck    Augmentin [Amoxicillin-Pot Clavulanate] Rash    Other      Due to eczema on hands, unable to touch raw fruits and vegetables and most perfume soaps and lotions. Causes blisters, breaking of skin, and bleeding.   Pt reports she can eat raw friut and vegetables, just can't touch them with her hands.    Adhesive Rash       Home Medications    Current Outpatient Medications   Medication Sig Dispense Refill    albuterol (PROVENTIL HFA;VENTOLIN HFA) 90 mcg/actuation inhaler Inhale 2 puffs daily as needed.      apremilast (OTEZLA STARTER) 10 mg (4)-20 mg (4)-30 mg (47) DsPk Take 10mg  once on day 1 (d1), 10mg  2x/day (d2), 10mg  in AM and 20mg  in PM (d3), 20mg  2x/day (d4), 20mg  in AM and 30mg  in PM (d5), and 30mg  2x/day after that 55 tablet 0    apremilast (OTEZLA) 30 mg Tab Take 1 tablet by mouth twice a day to treat rash on hands 60 tablet 11    cetirizine (ZYRTEC) 10 MG tablet Take 1 tablet (10 mg total) by mouth daily.      clobetasol (TEMOVATE) 0.05 % ointment Apply topically two (2) times a day. 30 g 2    cyanocobalamin, vitamin B-12, 1,000 mcg/mL injection Frequency:Q2W   Dosage:1   ML  Instructions:  Note:Dose:      fluticasone propionate (FLONASE) 50 mcg/actuation nasal spray 1 spray into each nostril daily.      fluticasone propionate (FLOVENT HFA) 220 mcg/actuation inhaler Inhale 2 puffs daily as needed. Frequency:BID   Dosage:220   MCG  Instructions:  Note:Dose:      hydrOXYzine (ATARAX) 25 MG tablet Take 1 tablet (25 mg total) by mouth two (2) times a day as needed. 60 tablet 0    ibuprofen (ADVIL,MOTRIN) 200 MG tablet Take 1 tablet (200 mg total) by mouth every six (6) hours as needed for pain. 2 tabs as  needed      lubiprostone (AMITIZA) 24 MCG capsule Take 1 capsule (24 mcg total) by mouth daily as needed. 90 capsule 0    naloxone (NARCAN) 4 mg nasal spray One spray in either nostril once for known/suspected opioid overdose. May repeat every 2-3 minutes in alternating nostril til EMS arrives 2 each 0    oxyCODONE (OXYCONTIN) 60 mg 12 hr crush resistant ER/CR tablet Take 1 tablet (60 mg total) by mouth every eight (8) hours. OK to fill 09/01/22 90 tablet 0    oxyCODONE (OXYCONTIN) 60 mg 12 hr crush resistant ER/CR tablet Take 1 tablet (60 mg total) by mouth every eight (8) hours. DNF: 10/07/22 90 tablet 0    oxyCODONE (OXYCONTIN) 60 mg 12 hr crush resistant ER/CR tablet Take 1 tablet (60 mg total) by mouth every eight (8) hours. 90 tablet 0    oxyCODONE (ROXICODONE) 10 mg immediate release tablet Take 1 tablet (10 mg total) by mouth every six (6) hours as needed for pain. 120 tablet 0    pantoprazole (PROTONIX) 40 MG tablet Take 1 tablet (40 mg total) by mouth daily.      promethazine (PHENERGAN) 25 MG tablet Take 1 tablet (25 mg total) by mouth once as needed.      salmeterol (SEREVENT) 50 mcg/dose diskus inhaler Inhale 1 puff.      tiotropium (SPIRIVA) 18 mcg inhalation capsule Place 1 capsule (18 mcg total) into inhaler and inhale.      tizanidine (ZANAFLEX) 4 MG tablet Take 1 tablet (4 mg total) by mouth every eight (8) hours as needed. 90 tablet 2     No current facility-administered medications for this visit.     Imaging:  MRI Cervical Spine 10/07/22  Impression   Moderate to marked multilevel degenerative changes throughout Carla cervical spine, most prominently;   1. Severe spinal canal narrowing and moderate bilateral neuroforaminal narrowing Carla Carla C4-5 level. Mildly increased T2 signal abnormality of Carla spinal cord Carla this level, possibly reflecting myomalacia.   2. Moderate spinal canal narrowing Carla C3-4 and C5-6.       MRI Lumbar Spine 10/07/22  Impression   Moderate to marked multilevel degenerative changes throughout Carla cervical spine, most prominently;   1. Severe spinal canal narrowing and moderate bilateral neuroforaminal narrowing Carla Carla C4-5 level. Mildly increased T2 signal abnormality of Carla spinal cord Carla this level, possibly reflecting myomalacia.   2. Moderate spinal canal narrowing Carla C3-4 and C5-6.         Review Of Systems:  See questionnaire above.      Physical Exam:  VITALS:   There were no vitals filed for this visit.      Wt Readings from Evans 3 Encounters:   09/01/22 72.2 kg (159 lb 1.6 oz)   06/09/22 75.3 kg (166 lb 1.6 oz)   03/12/22 78 kg (171 lb 14.4 oz)     GENERAL:  Carla Evans is well developed, well-nourished, and appears to be in no apparent distress.   HEAD/NECK:    Normocephalic/atraumatic. clear sclera, pupils not pinpoint  CV:  Warm and well perfused   LUNGS:   Normal work of breathing, no supplemental O2  EXTREMITIES:  No clubbing, cyanosis noted.  NEUROLOGIC:    Carla Evans is alert and oriented, speech fluent, normal language. Positive straight leg raise on Carla left side   MUSCULOSKELETAL:    Motor function  preserved. 5/5 strength in Carla upper and lower extremities   GAIT:  Carla  Evans rises from a seated position with no difficulty and ambulates with nonantalgic gait without Carla assistance of a walking aid.   SKIN:   No obvious rashes, lesions, or erythema.  PSY:   Appropriate affect. No overt pain behaviors. No evidence of psychomotor retardation or agitation, no signs of intoxication.       Answers submitted by Carla Evans for this visit:  Back Pain Questionnaire (Submitted on 11/26/2022)  Chief Complaint: Back pain  Chronicity: chronic  Onset: more than 1 year ago  Frequency: constantly  Progression since onset: rapidly worsening  Pain location: lumbar spine, sacro-iliac, thoracic spine  Pain quality: aching, burning, shooting, stabbing  Radiates to: left foot, left thigh, right foot, right thigh  Pain - numeric: 10/10  Pain is: Carla same all Carla time  Aggravated by: bending, coughing, position, lying down, sitting, standing, stress, twisting  Stiffness is present: in Carla morning  abdominal pain: No  bladder incontinence: Yes  bowel incontinence: No  chest pain: Yes  dysuria: No  fever: No  headaches: Yes  leg pain: Yes  numbness: Yes  paresis: No  paresthesias: Yes  pelvic pain: No  perianal numbness: No  tingling: Yes  weakness: Yes  weight loss: Yes  Risk factors: lack of exercise, menopause, poor posture, sedentary lifestyle      Sabino Niemann, DO   Anesthesiology PGY-1

## 2022-12-01 NOTE — Unmapped (Signed)
Hydroxyzine 25mg : Take 1 tab po bid prn  refill request; pended and routed to provider

## 2022-12-01 NOTE — Unmapped (Addendum)
It was nice seeing you in the clinic today!     We have refilled your prescription medications     We have also sent in a referral for you to see Dr. Lorenda Peck for evaluation of your neck pain

## 2022-12-01 NOTE — Unmapped (Signed)
Medication:oxyCODONE (OXYCONTIN) 60 mg 12 hr crush resistant ER/CR tablet   ZOX:WRUE 1 tablet (60 mg total) by mouth every eight (8) hours.   Quantity on RX: #90  Filled on:11/05/22  Pill count today: # 11    Medication:oxyCODONE (ROXICODONE) 10 mg immediate release tablet   SIG:: Take 1 tablet (10 mg total) by mouth every six (6) hours as needed for pain   Quantity on RX: #120  Filled on:11/05/22  Pill count today: # 14

## 2022-12-02 DIAGNOSIS — L309 Dermatitis, unspecified: Principal | ICD-10-CM

## 2022-12-02 MED ORDER — HYDROXYZINE HCL 25 MG TABLET
ORAL_TABLET | Freq: Two times a day (BID) | ORAL | 0 refills | 90.00000 days | Status: CP | PRN
Start: 2022-12-02 — End: ?

## 2022-12-05 MED ORDER — OXYCODONE 10 MG TABLET
ORAL_TABLET | Freq: Four times a day (QID) | ORAL | 0 refills | 30 days | Status: CP | PRN
Start: 2022-12-05 — End: 2022-12-01

## 2022-12-05 MED ORDER — OXYCODONE ER 60 MG TABLET,CRUSH RESISTANT,EXTENDED RELEASE 12 HR
ORAL_TABLET | Freq: Three times a day (TID) | ORAL | 0 refills | 30 days | Status: CP
Start: 2022-12-05 — End: 2022-12-01

## 2022-12-07 LAB — DRUG SCREEN, PAIN CLINIC, URINE
2-HYDROXY ETHYL FLURAZEPAM, UR: NOT DETECTED ng/mL
6-MONOACETYLMORPHINE, UR: NOT DETECTED ng/mL
7-AMINOCLONAZEPAM, UR: NOT DETECTED ng/mL
7-AMINOFLUNITRAZEPAM, UR: NOT DETECTED ng/mL
ALPHA-HYDROXY MIDAZOLAM, UR: NOT DETECTED ng/mL
ALPHA-HYDROXY TRIAZOLAM, UR: NOT DETECTED ng/mL
ALPHA-HYDROXYALPRAZOLAM GLUCURONIDE, UR: NOT DETECTED ng/mL
ALPRAZOLAM, UR: NOT DETECTED ng/mL
AMPHETAMINE, UR: NOT DETECTED ng/mL
BUPRENORPHINE, UR: NOT DETECTED ng/mL
CHLORDIAZEPOXIDE, UR: NOT DETECTED ng/mL
CLOBAZAM, UR: NOT DETECTED ng/mL
CLONAZEPAM, UR: NOT DETECTED ng/mL
COCAINE METABOLITES URINE: NEGATIVE ng/mL
CODEINE, UR: NOT DETECTED ng/mL
CODEINE-6-BETA-GLUCURONIDE, UR: NOT DETECTED ng/mL
COMMENT,URINE: NORMAL
CREATININE-O-ADULT: 137.6 mg/dL
DIAZEPAM, UR: NOT DETECTED ng/mL
DIHYDROCODEINE, UR: NOT DETECTED ng/mL
EDDP, UR: NOT DETECTED ng/mL
EPHEDRINE, UR: NOT DETECTED ng/mL
FLUNITRAZEPAM, UR: NOT DETECTED ng/mL
FLURAZEPAM, UR: NOT DETECTED ng/mL
HYDROCODONE, UR: NOT DETECTED ng/mL
HYDROMORPHONE, UR: NOT DETECTED ng/mL
HYDROMORPHONE-3-BETA-GLUCURONIDE, UR: NOT DETECTED ng/mL
LORAZEPAM GLUCURONIDE, UR: NOT DETECTED ng/mL
LORAZEPAM, UR: NOT DETECTED ng/mL
MDA, UR: NOT DETECTED ng/mL
MDEA, UR: NOT DETECTED ng/mL
MDMA, UR: NOT DETECTED ng/mL
MEPERIDINE, UR: NOT DETECTED ng/mL
METHADONE, UR: NOT DETECTED ng/mL
METHAMPHETAMINE, UR: NOT DETECTED ng/mL
METHYLPHENIDATE, UR: NOT DETECTED ng/mL
MIDAZOLAM, UR: NOT DETECTED ng/mL
MORPHINE, UR: NOT DETECTED ng/mL
MORPHINE-6-BETA-GLUCURONIDE, UR: NOT DETECTED ng/mL
N-DESMETHYLCLOBAZAM, UR: NOT DETECTED ng/mL
N-DESMETHYLTAPENTADOL, UR: NOT DETECTED ng/mL — AB
NALOXONE, UR: NOT DETECTED ng/mL
NORBUPRENOORPHINE GLUCURONIDE, UR: NOT DETECTED ng/mL
NORBUPRENORPHINE, UR: NOT DETECTED ng/mL
NORDIAZEPAM, UR: NOT DETECTED ng/mL
NORFENTANYL, UR: NOT DETECTED ng/mL
NORHYDROCODONE, UR: NOT DETECTED ng/mL
NORMEPERIDINE, UR: NOT DETECTED ng/mL
OXAZEPAM GLUCURONIDE, UR: NOT DETECTED ng/mL
OXAZEPAM, UR: NOT DETECTED ng/mL
OXIDANTS-O-ADULT: NEGATIVE
PH-O-ADULT: 5.6
PHENCYCLIDINE, UR: NOT DETECTED ng/mL
PHENTERMINE, UR: NOT DETECTED ng/mL
PRAZEPAM, UR: NOT DETECTED ng/mL
PROPOXYPHENE, UR: NOT DETECTED ng/mL
PSEUDOEPHEDRINE, UR: NOT DETECTED ng/mL
RITALINIC ACID, UR: NOT DETECTED ng/mL
SPECIFIC GRAVITY-O-ADULT: 1.02
TAPENTADOL, UR: NOT DETECTED ng/mL
TEMAZEPAM GLUCURONIDE, UR: NOT DETECTED ng/mL
TEMAZEPAM, UR: NOT DETECTED ng/mL
THC, SCREEN URINE: NEGATIVE ng/mL
TRAMADOL, UR: NOT DETECTED ng/mL
TRIAZOLAM, UR: NOT DETECTED ng/mL
URINE BARBITURATES MAYO: NEGATIVE ng/mL
ZOLPIDEM PHENYL-4-CARBOXYLIC ACID, UR: NOT DETECTED ng/mL
ZOLPIDEM, UR: NOT DETECTED ng/mL

## 2022-12-08 MED FILL — OTEZLA 30 MG TABLET: 30 days supply | Qty: 60 | Fill #0

## 2022-12-23 DIAGNOSIS — L309 Dermatitis, unspecified: Principal | ICD-10-CM

## 2022-12-23 MED ORDER — HYDROXYZINE HCL 25 MG TABLET
ORAL_TABLET | Freq: Two times a day (BID) | ORAL | 0 refills | 90.00000 days | PRN
Start: 2022-12-23 — End: ?

## 2022-12-23 NOTE — Unmapped (Signed)
Nurse received voicemail from patient requesting refills for hydroxyzine 25 mg tablets     LOV: 10/07/2022  ROV: None scheduled     Medications pended and routed to provider    Requested Prescriptions     Pending Prescriptions Disp Refills    hydrOXYzine (ATARAX) 25 MG tablet 180 tablet 0     Sig: Take 1 tablet (25 mg total) by mouth two (2) times a day as needed.     Recent Visits  Date Type Provider Dept   10/07/22 Office Visit Arvilla Market, MD [facility]   Showing recent visits within past 540 days with a meds authorizing provider and meeting all other requirements  Future Appointments  No visits were found meeting these conditions.  Showing future appointments within next 150 days with a meds authorizing provider and meeting all other requirements

## 2022-12-27 MED ORDER — HYDROXYZINE HCL 25 MG TABLET
ORAL_TABLET | Freq: Two times a day (BID) | ORAL | 0 refills | 90.00000 days | Status: CP | PRN
Start: 2022-12-27 — End: ?

## 2022-12-31 DIAGNOSIS — M961 Postlaminectomy syndrome, not elsewhere classified: Principal | ICD-10-CM

## 2022-12-31 MED ORDER — OXYCODONE ER 60 MG TABLET,CRUSH RESISTANT,EXTENDED RELEASE 12 HR
ORAL_TABLET | Freq: Three times a day (TID) | ORAL | 0 refills | 30 days | Status: CP
Start: 2022-12-31 — End: ?
  Filled 2023-01-02: qty 90, 30d supply, fill #0

## 2022-12-31 MED ORDER — OXYCODONE 10 MG TABLET
ORAL_TABLET | Freq: Four times a day (QID) | ORAL | 0 refills | 30 days | Status: CP | PRN
Start: 2022-12-31 — End: 2023-01-30

## 2022-12-31 NOTE — Unmapped (Unsigned)
PT requesting early refill on Oxycontin 60mg  and oxycodone 10mg  due to death in family. Per PMP pt last filled both medications on 12/01/2022.    Please advise.

## 2023-01-02 MED FILL — OXYCODONE 10 MG TABLET: ORAL | 30 days supply | Qty: 120 | Fill #0

## 2023-01-04 MED ORDER — OXYCODONE ER 60 MG TABLET,CRUSH RESISTANT,EXTENDED RELEASE 12 HR
ORAL_TABLET | Freq: Three times a day (TID) | ORAL | 0 refills | 30 days | Status: CP
Start: 2023-01-04 — End: ?

## 2023-01-04 MED ORDER — OXYCODONE 10 MG TABLET
ORAL_TABLET | Freq: Four times a day (QID) | ORAL | 0 refills | 30 days | Status: CP | PRN
Start: 2023-01-04 — End: 2023-02-03

## 2023-01-13 NOTE — Unmapped (Signed)
[facility] Clinical Intervention    Type of intervention: Side effect management    Medication involved: Otezla    Problem identified: Patient called in to report she's stopped Otezla due to adverse effects. She has continued to have severe headaches, and she reports taking up to 12 ibuprofen/day for management. She reports trialing 1/day dosing for a while, but then stopped when symptoms didn't improve.    She also reports severe GI cramping as well, although not diarrhea.    Intervention performed: I advised since she's stopped therapy already, we'd loop in clinic to see if they can see her back for followup. I discussed we usually will trial 1/day dosing for a period, but she reports she already has tried this. I agreed 12/day ibuprofen was unsafe and supported her decision to stop therapy.    Follow-up needed: will inform clinic     Approximate time spent: 15-20 minutes    Clinical evidence used to support intervention: Professional judgement    Result of the intervention: Discontinuation of inappropriate therapy    Leif Loflin A Desiree Lucy [facility] Specialty Pharmacist

## 2023-01-18 NOTE — Unmapped (Signed)
The [facility] has made a third and final attempt to reach this patient to refill the following medication:Otezla.      We have left voicemails on the following phone numbers: [phone], have sent a MyChart message, and have sent a text message to the following phone numbers: [phone] .    Dates contacted: 4/18 /2024  4/30  /2024  01/18/2023  Last scheduled delivery: 12/08/2022    The patient may be at risk of non-compliance with this medication. The patient should call the [facility] at [phone]  Option 4, then Option 2: Dermatology, Gastroenterology, Rheumatology to refill medication.    Carla Evans   [facility]

## 2023-01-24 DIAGNOSIS — L2089 Other atopic dermatitis: Principal | ICD-10-CM

## 2023-01-24 MED ORDER — DUPILUMAB 300 MG/2 ML SUBCUTANEOUS PEN INJECTOR
SUBCUTANEOUS | 0 refills | 0.00000 days | Status: CP
Start: 2023-01-24 — End: ?
  Filled 2023-02-02: qty 4, 14d supply, fill #0

## 2023-01-25 DIAGNOSIS — L2089 Other atopic dermatitis: Principal | ICD-10-CM

## 2023-01-25 NOTE — Unmapped (Signed)
Sent dupixent Rx to [facility] for recalcitrant hand eczema/atopic neurodermatitis

## 2023-01-27 NOTE — Unmapped (Addendum)
[facility]   Patient Onboarding/Medication Counseling    Sent MyChart instructions and link to mfg video  Set-up delivery for loading dose to be clinic couriered to [facility] on 5/22 and pt will pick-up on 5/23 and patient will start loading dose then  Maintenance dose scheduled for delivery on 6/4 to prescription address    Carla Evans is a 62 y.o. female with atopic  who I am counseling today on initiation of therapy.  I am speaking to the patient.    Was a Nurse, learning disability used for this call? No    Verified patient's date of birth / HIPAA.    Specialty medication(s) to be sent: Inflammatory Disorders: Dupixent      Non-specialty medications/supplies to be sent:  sharps      Medications not needed at this time: n/a         Dupixent (dupilumab)    Medication & Administration     Dosage: Atopic dermatitis: Inject 600mg  under the skin as a loading dose followed by 300mg  every 14 days thereafter    Administration:     Dupixent Pen  1. Gather all supplies needed for injection on a clean, flat working surface: medication syringe removed from packaging, alcohol swab, sharps container, etc.  2. Look at the medication label - look for correct medication, correct dose, and check the expiration date  3. Look at the medication - the liquid in the pen should appear clear and colorless to pale yellow  4. Lay the pen on a flat surface and allow it to warm up to room temperature for at least 45 minutes  5. Select injection site - you can use the front of your thigh or your belly (but not the area 2 inches around your belly button); if someone else is giving you the injection you can also use your upper arm in the skin covering your triceps muscle  6. Prepare injection site - wash your hands and clean the skin at the injection site with an alcohol swab and let it air dry, do not touch the injection site again before the injection  7. Hold the middle of the body of the pen and gently pull the needle safety cap straight out. Be careful not to bend the needle. Do not remove until immediately prior to injection  8. Press the pen down onto the injection site at a 90 degree angle.   9. You will hear a click as the injection starts, and then a second click when the injection is ALMOST done. Keep holding the pen against the skin for 5 more seconds after the second click.   10. Check that the pen is empty by looking in the viewing window - the yellow indicator bar should be stopped, and should fill the window.   11. Remove the pen from the skin by lifting straight up.   12. Dispose of the used pen immediately in your sharps disposal container  13. If you see any blood at the injection site, press a cotton ball or gauze on the site and maintain pressure until the bleeding stops, do not rub the injection site    Adherence/Missed dose instructions:  If a dose is missed, administer within 7 days from the missed dose and then resume the original schedule. If the missed dose is not administered within 7 days, you can either wait until the next dose on the original schedule or take your dose now and resume every 14 days from the new  injection date. Do not use 2 doses at the same time or extra doses.      Goals of Therapy     -Reduce symptoms of pruritus and dermatitis  -Prevent exacerbations  -Minimize therapeutic risks  -Avoidance of long-term systemic and topical glucocorticoid use  -Maintenance of effective psychosocial functioning    Side Effects & Monitoring Parameters     Injection site reaction (redness, irritation, inflammation localized to the site of administration)  Signs of a common cold - minor sore throat, runny or stuffy nose, etc.  Recurrence of cold sores (herpes simplex)      The following side effects should be reported to the provider:  Signs of a hypersensitivity reaction - rash; hives; itching; red, swollen, blistered, or peeling skin; wheezing; tightness in the chest or throat; difficulty breathing, swallowing, or talking; swelling of the mouth, face, lips, tongue, or throat; etc.  Eye pain or irritation or any visual disturbances  Shortness of breath or worsening of breathing      Contraindications, Warnings, & Precautions     Have your bloodwork checked as you have been told by your prescriber   Birth control pills and other hormone-based birth control may not work as well to prevent pregnancy  Talk with your doctor if you are pregnant, planning to become pregnant, or breastfeeding  Discuss the possible need for holding your dose(s) of Dupixent?? when a planned procedure is scheduled with the prescriber as it may delay healing/recovery timeline       Drug/Food Interactions     Medication list reviewed in Epic. The patient was instructed to inform the care team before taking any new medications or supplements. No drug interactions identified.   Talk with you prescriber or pharmacist before receiving any live vaccinations while taking this medication and after you stop taking it    Storage, Handling Precautions, & Disposal     Store this medication in the refrigerator.  Do not freeze  If needed, you may store at room temperature for up to 14 days  Store in original packaging, protected from light  Do not shake  Dispose of used syringes in a sharps disposal container      Current Medications (including OTC/herbals), Comorbidities and Allergies     Current Outpatient Medications   Medication Sig Dispense Refill    albuterol (PROVENTIL HFA;VENTOLIN HFA) 90 mcg/actuation inhaler Inhale 2 puffs daily as needed.      apremilast (OTEZLA STARTER) 10 mg (4)-20 mg (4)-30 mg (47) DsPk Take 10mg  once on day 1 (d1), 10mg  2x/day (d2), 10mg  in AM and 20mg  in PM (d3), 20mg  2x/day (d4), 20mg  in AM and 30mg  in PM (d5), and 30mg  2x/day after that 55 tablet 0    apremilast (OTEZLA) 30 mg Tab Take 1 tablet by mouth twice a day to treat rash on hands 60 tablet 11    cetirizine (ZYRTEC) 10 MG tablet Take 1 tablet (10 mg total) by mouth daily.      clobetasol (TEMOVATE) 0.05 % ointment Apply topically two (2) times a day. 30 g 2    cyanocobalamin, vitamin B-12, 1,000 mcg/mL injection Frequency:Q2W   Dosage:1   ML  Instructions:  Note:Dose:      dupilumab 300 mg/2 mL PnIj Inject the contents of 1 pen (300 mg) under the skin every fourteen (14) days to treat rash. 4 mL 11    dupilumab 300 mg/2 mL PnIj Inject the contents of 2 pens (600mg ) under the skin for loading dose  6 mL 0    fluticasone propionate (FLONASE) 50 mcg/actuation nasal spray 1 spray into each nostril daily.      fluticasone propionate (FLOVENT HFA) 220 mcg/actuation inhaler Inhale 2 puffs daily as needed. Frequency:BID   Dosage:220   MCG  Instructions:  Note:Dose:      hydrOXYzine (ATARAX) 25 MG tablet Take 1 tablet (25 mg total) by mouth two (2) times a day as needed. 180 tablet 0    ibuprofen (ADVIL,MOTRIN) 200 MG tablet Take 1 tablet (200 mg total) by mouth every six (6) hours as needed for pain. 2 tabs as needed      lubiprostone (AMITIZA) 24 MCG capsule Take 1 capsule (24 mcg total) by mouth daily as needed. 90 capsule 0    naloxone (NARCAN) 4 mg nasal spray One spray in either nostril once for known/suspected opioid overdose. May repeat every 2-3 minutes in alternating nostril til EMS arrives 2 each 0    [START ON 02/03/2023] oxyCODONE (OXYCONTIN) 60 mg 12 hr crush resistant ER/CR tablet Take 1 tablet (60 mg total) by mouth every eight (8) hours. DNF till 02/03/23 90 tablet 0    oxyCODONE (OXYCONTIN) 60 mg 12 hr crush resistant ER/CR tablet Take 1 tablet (60 mg total) by mouth every eight (8) hours. 90 tablet 0    oxyCODONE (OXYCONTIN) 60 mg 12 hr crush resistant ER/CR tablet Take 1 tablet (60 mg total) by mouth every eight (8) hours. 90 tablet 0    [START ON 02/03/2023] oxyCODONE (ROXICODONE) 10 mg immediate release tablet Take 1 tablet (10 mg total) by mouth every six (6) hours as needed for pain. DNF till 02/03/23 120 tablet 0    oxyCODONE (ROXICODONE) 10 mg immediate release tablet Take 1 tablet (10 mg total) by mouth every six (6) hours as needed for pain. 120 tablet 0    pantoprazole (PROTONIX) 40 MG tablet Take 1 tablet (40 mg total) by mouth daily.      promethazine (PHENERGAN) 25 MG tablet Take 1 tablet (25 mg total) by mouth once as needed.      salmeterol (SEREVENT) 50 mcg/dose diskus inhaler Inhale 1 puff.      tiotropium (SPIRIVA) 18 mcg inhalation capsule Place 1 capsule (18 mcg total) into inhaler and inhale.      tizanidine (ZANAFLEX) 4 MG tablet Take 1 tablet (4 mg total) by mouth every eight (8) hours as needed. 90 tablet 2     No current facility-administered medications for this visit.       Allergies   Allergen Reactions    Celebrex [Celecoxib] Swelling    Latex Shortness Of Breath and Rash    Latex, Natural Rubber Shortness Of Breath and Rash    Vioxx [Rofecoxib] Swelling     Severe facial swelling. my face swelled up like a basketball    Xtampza Er [Oxycodone Myristate] Swelling     throat started to close burned neck    Augmentin [Amoxicillin-Pot Clavulanate] Rash    Other      Due to eczema on hands, unable to touch raw fruits and vegetables and most perfume soaps and lotions. Causes blisters, breaking of skin, and bleeding.   Pt reports she can eat raw friut and vegetables, just can't touch them with her hands.    Adhesive Rash       Patient Active Problem List   Diagnosis    Long term prescription opiate use    Sciatica of right side    Lumbar post-laminectomy syndrome  Cervicalgia    Chronic nonintractable headache    Lumbar canal stenosis    Mixed stress and urge urinary incontinence    Pain medication agreement signed       Reviewed and up to date in Epic.    Appropriateness of Therapy     Acute infections noted within Epic:  No active infections  Patient reported infection: None    Is medication and dose appropriate based on diagnosis and infection status? Yes    Prescription has been clinically reviewed: Yes      Baseline Quality of Life Assessment      How many days over the past month did your AD  keep you from your normal activities? For example, brushing your teeth or getting up in the morning. Patient declined to answer    Financial Information     Medication Assistance provided: Prior Authorization    Anticipated copay of $0 reviewed with patient. Verified delivery address.    Delivery Information     Scheduled delivery date: 5/22 to [facility] for loading dose; 6/4 - maintenance dose to prescription address    Expected start date: 5/23      Medication will be delivered via [facility] - [facility] on 5/22 clinic to the temporary address in [place].  This shipment will not require a signature.      Medication will be delivered via UPS on 6/4  to the prescription address in [facility].  This shipment will not require a signature.      Explained the services we provide at [facility] and that each month we would call to set up refills.  Stressed importance of returning phone calls so that we could ensure they receive their medications in time each month.  Informed patient that we should be setting up refills 7-10 days prior to when they will run out of medication.  A pharmacist will reach out to perform a clinical assessment periodically.  Informed patient that a welcome packet, containing information about our pharmacy and other support services, a Notice of Privacy Practices, and a drug information handout will be sent.      The patient or caregiver noted above participated in the development of this care plan and knows that they can request review of or adjustments to the care plan at any time.      Patient or caregiver verbalized understanding of the above information as well as how to contact the pharmacy at [phone] option 4 with any questions/concerns.  The pharmacy is open Monday through Friday 8:30am-4:30pm.  A pharmacist is available 24/7 via pager to answer any clinical questions they may have.    Patient Specific Needs     Does the patient have any physical, cognitive, or cultural barriers? No    Does the patient have adequate living arrangements? (i.e. the ability to store and take their medication appropriately) Yes    Did you identify any home environmental safety or security hazards? No    Patient prefers to have medications discussed with  Patient     Is the patient or caregiver able to read and understand education materials at a high school level or above? Yes    Patient's primary language is  English     Is the patient high risk? No    SOCIAL DETERMINANTS OF HEALTH     At the [facility], we have learned that life circumstances - like trouble affording food, housing, utilities, or transportation can affect  the health of many of our patients.   That is why we wanted to ask: are you currently experiencing any life circumstances that are negatively impacting your health and/or quality of life? No    Social Determinants of Psychologist, prison and probation services Strain: Not on file   Internet Connectivity: Not on file   Food Insecurity: Not on file   Tobacco Use: High Risk (12/01/2022)    Patient History     Smoking Tobacco Use: Every Day     Smokeless Tobacco Use: Never     Passive Exposure: Not on file   Housing/Utilities: Not on file   Alcohol Use: Not on file   Transportation Needs: Not on file   Substance Use: Not on file   Health Literacy: Not on file   Physical Activity: Not on file   Interpersonal Safety: Not on file   Stress: Not on file   Intimate Partner Violence: Not on file   Depression: Not on file   Social Connections: Not on file       Would you be willing to receive help with any of the needs that you have identified today? Not applicable       Teofilo Pod, PharmD  [facility] Specialty Pharmacist

## 2023-01-27 NOTE — Unmapped (Signed)
[facility] Specialty Medication Onboarding    Specialty Medication: DUPIXENT PEN 300 mg/2 mL Pnij (dupilumab)  Prior Authorization: Approved   Financial Assistance: No - copay  <$25  Final Copay/Day Supply: $0 / 14 days (LD)          $0 / 28 days (MD)    Insurance Restrictions: None     Notes to Pharmacist:   Credit Card on File: not applicable    The triage team has completed the benefits investigation and has determined that the patient is able to fill this medication at [facility]. Please contact the patient to complete the onboarding or follow up with the prescribing physician as needed.

## 2023-02-01 MED ORDER — EMPTY CONTAINER
3 refills | 0 days
Start: 2023-02-01 — End: ?

## 2023-02-02 MED ORDER — COURIERED MED OR SUPPLY
0 refills | 0 days
Start: 2023-02-02 — End: ?

## 2023-02-02 MED FILL — EMPTY CONTAINER: 120 days supply | Qty: 1 | Fill #0

## 2023-02-03 MED ORDER — OXYCODONE ER 60 MG TABLET,CRUSH RESISTANT,EXTENDED RELEASE 12 HR
ORAL_TABLET | Freq: Three times a day (TID) | ORAL | 0 refills | 30 days | Status: CP
Start: 2023-02-03 — End: ?

## 2023-02-03 MED ORDER — OXYCODONE 10 MG TABLET
ORAL_TABLET | Freq: Four times a day (QID) | ORAL | 0 refills | 30 days | Status: CP | PRN
Start: 2023-02-03 — End: 2023-03-05
  Filled 2023-02-03: qty 120, 30d supply, fill #0

## 2023-02-03 MED FILL — COURIERED MED OR SUPPLY: 1 days supply | Qty: 1 | Fill #0

## 2023-02-03 MED FILL — OXYCONTIN 60 MG TABLET,CRUSH RESISTANT,EXTENDED RELEASE: ORAL | 30 days supply | Qty: 90 | Fill #0

## 2023-02-14 MED FILL — DUPIXENT 300 MG/2 ML SUBCUTANEOUS PEN INJECTOR: SUBCUTANEOUS | 28 days supply | Qty: 4 | Fill #0

## 2023-03-02 NOTE — Unmapped (Unsigned)
[facility] Clinical Assessment & Refill Coordination Note    Carla Evans, DOB: 06-15-1961  Phone: [phone] (home) [phone] (work)    All above HIPAA information was verified with patient.     Was a Nurse, learning disability used for this call? No    Specialty Medication(s):   Inflammatory Disorders: Dupixent     Current Outpatient Medications   Medication Sig Dispense Refill    albuterol (PROVENTIL HFA;VENTOLIN HFA) 90 mcg/actuation inhaler Inhale 2 puffs daily as needed.      apremilast (OTEZLA STARTER) 10 mg (4)-20 mg (4)-30 mg (47) DsPk Take 10mg  once on day 1 (d1), 10mg  2x/day (d2), 10mg  in AM and 20mg  in PM (d3), 20mg  2x/day (d4), 20mg  in AM and 30mg  in PM (d5), and 30mg  2x/day after that 55 tablet 0    apremilast (OTEZLA) 30 mg Tab Take 1 tablet by mouth twice a day to treat rash on hands 60 tablet 11    cetirizine (ZYRTEC) 10 MG tablet Take 1 tablet (10 mg total) by mouth daily.      clobetasol (TEMOVATE) 0.05 % ointment Apply topically two (2) times a day. 30 g 2    COURIERED MED OR SUPPLY [id number], Dupixent, 300mg /44ml, subcutaneous injection.   [id number], Sharps Container, 1 box. 1 each 0    cyanocobalamin, vitamin B-12, 1,000 mcg/mL injection Frequency:Q2W   Dosage:1   ML  Instructions:  Note:Dose:      dupilumab 300 mg/2 mL PnIj Inject the contents of 1 pen (300 mg) under the skin every fourteen (14) days to treat rash. 4 mL 11    dupilumab 300 mg/2 mL PnIj Inject the contents of 2 pens (600mg ) under the skin for loading dose 6 mL 0    empty container Misc Use as directed 1 each 3    fluticasone propionate (FLONASE) 50 mcg/actuation nasal spray 1 spray into each nostril daily.      fluticasone propionate (FLOVENT HFA) 220 mcg/actuation inhaler Inhale 2 puffs daily as needed. Frequency:BID   Dosage:220   MCG  Instructions:  Note:Dose:      hydrOXYzine (ATARAX) 25 MG tablet Take 1 tablet (25 mg total) by mouth two (2) times a day as needed. 180 tablet 0 ibuprofen (ADVIL,MOTRIN) 200 MG tablet Take 1 tablet (200 mg total) by mouth every six (6) hours as needed for pain. 2 tabs as needed      lubiprostone (AMITIZA) 24 MCG capsule Take 1 capsule (24 mcg total) by mouth daily as needed. 90 capsule 0    naloxone (NARCAN) 4 mg nasal spray One spray in either nostril once for known/suspected opioid overdose. May repeat every 2-3 minutes in alternating nostril til EMS arrives 2 each 0    oxyCODONE (OXYCONTIN) 60 mg 12 hr crush resistant ER/CR tablet Take 1 tablet (60 mg total) by mouth every eight (8) hours. 90 tablet 0    oxyCODONE (OXYCONTIN) 60 mg 12 hr crush resistant ER/CR tablet Take 1 tablet (60 mg total) by mouth every eight (8) hours. 90 tablet 0    [START ON 05/04/2023] oxyCODONE (OXYCONTIN) 60 mg 12 hr crush resistant ER/CR tablet Take 1 tablet (60 mg total) by mouth every eight (8) hours. 90 tablet 0    [START ON 04/04/2023] oxyCODONE (OXYCONTIN) 60 mg 12 hr crush resistant ER/CR tablet Take 1 tablet (60 mg total) by mouth every eight (8) hours. DNF till 04/04/23 90 tablet 0    oxyCODONE (OXYCONTIN) 60 mg 12 hr crush resistant  ER/CR tablet Take 1 tablet (60 mg total) by mouth every eight (8) hours. DNF till 03/03/23 (refill 2 days early due to travel) 90 tablet 0    oxyCODONE (ROXICODONE) 10 mg immediate release tablet Take 1 tablet (10 mg total) by mouth every six (6) hours as needed for pain. DNF till 03/03/23 (refill 2 days early due to travel) 120 tablet 0    [START ON 05/04/2023] oxyCODONE (ROXICODONE) 10 mg immediate release tablet Take 1 tablet (10 mg total) by mouth every six (6) hours as needed for pain 120 tablet 0    [START ON 04/04/2023] oxyCODONE (ROXICODONE) 10 mg immediate release tablet Take 1 tablet (10 mg total) by mouth every six (6) hours as needed for pain 120 tablet 0    oxyCODONE (ROXICODONE) 10 mg immediate release tablet Take 1 tablet (10 mg total) by mouth every six (6) hours as needed for pain. DNF 03/03/23 (2 days early due to travel) 120 tablet 0    pantoprazole (PROTONIX) 40 MG tablet Take 1 tablet (40 mg total) by mouth daily.      promethazine (PHENERGAN) 25 MG tablet Take 1 tablet (25 mg total) by mouth once as needed.      salmeterol (SEREVENT) 50 mcg/dose diskus inhaler Inhale 1 puff.      tiotropium (SPIRIVA) 18 mcg inhalation capsule Place 1 capsule (18 mcg total) into inhaler and inhale.      tizanidine (ZANAFLEX) 4 MG tablet Take 1 tablet (4 mg total) by mouth every eight (8) hours as needed. 90 tablet 2     No current facility-administered medications for this visit.        Changes to medications: [name] reports no changes at this time.    Allergies   Allergen Reactions    Celebrex [Celecoxib] Swelling    Latex Shortness Of Breath and Rash    Latex, Natural Rubber Shortness Of Breath and Rash    Vioxx [Rofecoxib] Swelling     Severe facial swelling. my face swelled up like a basketball    Xtampza Er [Oxycodone Myristate] Swelling     throat started to close burned neck    Augmentin [Amoxicillin-Pot Clavulanate] Rash    Other      Due to eczema on hands, unable to touch raw fruits and vegetables and most perfume soaps and lotions. Causes blisters, breaking of skin, and bleeding.   Pt reports she can eat raw friut and vegetables, just can't touch them with her hands.    Adhesive Rash       Changes to allergies: No    SPECIALTY MEDICATION ADHERENCE     dupilumab 300 mg/2 mL PnIj: 0 days of medicine on hand   Medication Adherence    Patient reported X missed doses in the last month: 0  Specialty Medication: Dupixent 300mg /24mL  Informant: patient  Confirmed plan for next specialty medication refill: delivery by pharmacy  Refills needed for supportive medications: not needed          Specialty medication(s) dose(s) confirmed: Regimen is correct and unchanged.     Are there any concerns with adherence? No    Adherence counseling provided? Not needed    CLINICAL MANAGEMENT AND INTERVENTION      Clinical Benefit Assessment:    Do you feel the medicine is effective or helping your condition? Yes    Clinical Benefit counseling provided? Not needed    Adverse Effects Assessment:    Are you experiencing any side effects? No  Are you experiencing difficulty administering your medicine? No    Quality of Life Assessment:    Quality of Life    Rheumatology  Oncology  Dermatology  1. What impact has your specialty medication had on the symptoms of your skin condition (i.e. itchiness, soreness, stinging)?: Tremendous  2. What impact has your specialty medication had on your comfort level with your skin?: Tremendous  Cystic Fibrosis          How many days over the past month did your AD  keep you from your normal activities? For example, brushing your teeth or getting up in the morning. Patient declined to answer    Have you discussed this with your provider? Not needed    Acute Infection Status:    Acute infections noted within Epic:  No active infections  Patient reported infection: None    Therapy Appropriateness:    Is therapy appropriate and patient progressing towards therapeutic goals? Yes, therapy is appropriate and should be continued    DISEASE/MEDICATION-SPECIFIC INFORMATION      For patients on injectable medications: Patient currently has 0 doses left.  Next injection is scheduled for 03/16/2023.    Chronic Inflammatory Diseases: Have you experienced any flares in the last month? No  Has this been reported to your provider? No    PATIENT SPECIFIC NEEDS     Does the patient have any physical, cognitive, or cultural barriers? No    Is the patient high risk? No    Did the patient require a clinical intervention? No    Does the patient require physician intervention or other additional services (i.e., nutrition, smoking cessation, social work)? No    SOCIAL DETERMINANTS OF HEALTH     At the [facility], we have learned that life circumstances - like trouble affording food, housing, utilities, or transportation can affect the health of many of our patients.   That is why we wanted to ask: are you currently experiencing any life circumstances that are negatively impacting your health and/or quality of life? Patient declined to answer    Social Determinants of Health     Financial Resource Strain: Not on file   Internet Connectivity: Not on file   Food Insecurity: Not on file   Tobacco Use: High Risk (03/03/2023)    Patient History     Smoking Tobacco Use: Every Day     Smokeless Tobacco Use: Never     Passive Exposure: Not on file   Housing/Utilities: Not on file   Alcohol Use: Not on file   Transportation Needs: Not on file   Substance Use: Not on file   Health Literacy: Not on file   Physical Activity: Not on file   Interpersonal Safety: Not on file   Stress: Not on file   Intimate Partner Violence: Not on file   Depression: Not on file   Social Connections: Not on file       Would you be willing to receive help with any of the needs that you have identified today? Not applicable       SHIPPING     Specialty Medication(s) to be Shipped:   Inflammatory Disorders: Dupixent    Other medication(s) to be shipped: No additional medications requested for fill at this time     Changes to insurance: No    Delivery Scheduled: Yes, Expected medication delivery date: 03/14/2023.     Medication will be delivered via UPS to the confirmed prescription address in [place].  The patient will receive a drug information handout for each medication shipped and additional FDA Medication Guides as required.  Verified that patient has previously received a Conservation officer, historic buildings and a Surveyor, mining.    The patient or caregiver noted above participated in the development of this care plan and knows that they can request review of or adjustments to the care plan at any time.      All of the patient's questions and concerns have been addressed.    Elnora Morrison, PharmD   [facility] Specialty Pharmacist adjustments to the care plan at any time.      All of the patient's questions and concerns have been addressed.    Elnora Morrison, PharmD   [facility] Specialty Pharmacist

## 2023-03-03 ENCOUNTER — Ambulatory Visit: Admit: 2023-03-03 | Discharge: 2023-03-04 | Payer: MEDICARE

## 2023-03-03 DIAGNOSIS — M961 Postlaminectomy syndrome, not elsewhere classified: Principal | ICD-10-CM

## 2023-03-03 DIAGNOSIS — Z79891 Long term (current) use of opiate analgesic: Principal | ICD-10-CM

## 2023-03-03 DIAGNOSIS — M79671 Pain in right foot: Principal | ICD-10-CM

## 2023-03-03 MED ORDER — OXYCODONE ER 60 MG TABLET,CRUSH RESISTANT,EXTENDED RELEASE 12 HR
ORAL_TABLET | Freq: Three times a day (TID) | ORAL | 0 refills | 30.00000 days | Status: CP
Start: 2023-03-03 — End: 2023-03-03

## 2023-03-03 MED ORDER — TIZANIDINE 4 MG TABLET
ORAL_TABLET | Freq: Three times a day (TID) | ORAL | 2 refills | 30 days | Status: CP | PRN
Start: 2023-03-03 — End: 2023-03-03
  Filled 2023-03-03: qty 90, 30d supply, fill #0

## 2023-03-03 MED ORDER — OXYCODONE 10 MG TABLET
ORAL_TABLET | Freq: Four times a day (QID) | ORAL | 0 refills | 30.00000 days | Status: CP | PRN
Start: 2023-03-03 — End: 2023-04-02
  Filled 2023-03-03: qty 120, 30d supply, fill #0

## 2023-03-03 MED ORDER — LUBIPROSTONE 24 MCG CAPSULE
ORAL_CAPSULE | Freq: Every day | ORAL | 0 refills | 90 days | Status: CN | PRN
Start: 2023-03-03 — End: ?

## 2023-03-03 MED ORDER — NALOXONE 4 MG/ACTUATION NASAL SPRAY
0 refills | 0 days | Status: CP
Start: 2023-03-03 — End: 2023-03-03

## 2023-03-03 MED FILL — OXYCONTIN 60 MG TABLET,CRUSH RESISTANT,EXTENDED RELEASE: ORAL | 30 days supply | Qty: 90 | Fill #0

## 2023-03-03 NOTE — Unmapped (Signed)
Medication: Oxycontin 60 mg   SIG: Take 1 tablet by mouth every 8 hours as needed   Quantity on RX: # 90  Filled on: 02/03/23  Pill count today: # 7    Medication: Oxycodone 10 mg IR  SIG: Take 1 tablet by mouth every 6 hours as needed for pain   Quantity on RX: # 120  Filled on: 02/03/23  Pill count today: # 8

## 2023-03-03 NOTE — Unmapped (Signed)
Thank you for visiting today and discussing your health concerns with me. I appreciate your commitment to improving your health and managing your conditions effectively.    Here is a summary of the key instructions and next steps from our consultation:    - Medication Refills:    - Oxycontin 60 mg every 8 hours    - Oxycodone 10 mg every 6 hours    - Tizanidine (Zanaflex) as prescribed    - Narcan rescue medication (must be renewed annually)    - Continue your current regimen for asthma and discuss smoking cessation options    - Referrals and Follow-ups:    - I will refer you to a podiatrist for your right foot pain. Please take the referral paper to the podiatrist in your area.    - Ensure to follow up with the spine surgeon to discuss your back issues and explore treatment options, including the possibility of surgery.    - Lifestyle Adjustments:    - Continue eating prunes to manage constipation effectively.    - Discuss potential impacts of smoking on surgical options with your healthcare providers.    Please ensure to schedule and attend all necessary appointments and follow the instructions provided for medication refills. If you have any questions or need further assistance, feel free to contact our office.    Warm regards,    Clarene Essex, MD

## 2023-03-03 NOTE — Unmapped (Signed)
AVS and Rxes reviewed.  Below controlled  Rxes sent electronically to pt's pharmacy.  Pt voiced understanding.   Disp Refills Start End    oxyCODONE (OXYCONTIN) 60 mg 12 hr crush resistant ER/CR tablet 90 tablet 0 05/04/2023 --    Sig - Route: Take 1 tablet (60 mg total) by mouth every eight (8) hours. - Oral    Sent to pharmacy as: oxyCODONE ER 60 mg tablet,crush resistant,extended release 12 hr (OxyCONTIN)    Earliest Fill Date: 05/04/2023    Notes to Pharmacy: For chronic pain. OK to fill early if fill date falls on weekend/holiday and pharmacy closed.     Disp Refills Start End    oxyCODONE (OXYCONTIN) 60 mg 12 hr crush resistant ER/CR tablet 90 tablet 0 04/04/2023 --    Sig - Route: Take 1 tablet (60 mg total) by mouth every eight (8) hours. DNF till 04/04/23 - Oral    Sent to pharmacy as: oxyCODONE ER 60 mg tablet,crush resistant,extended release 12 hr (OxyCONTIN)    Earliest Fill Date: 04/04/2023    Notes to Pharmacy: For chronic pain. OK to fill early if fill date falls on weekend/holiday and pharmacy closed.       Disp Refills Start End    oxyCODONE (OXYCONTIN) 60 mg 12 hr crush resistant ER/CR tablet 90 tablet 0 03/03/2023 --    Sig - Route: Take 1 tablet (60 mg total) by mouth every eight (8) hours. DNF till 03/03/23 (refill 2 days early due to travel) - Oral    Sent to pharmacy as: oxyCODONE ER 60 mg tablet,crush resistant,extended release 12 hr (OxyCONTIN)    Earliest Fill Date: 03/03/2023    Notes to Pharmacy: For chronic pain. OK to fill early if fill date falls on weekend/holiday and pharmacy closed.       Disp Refills Start End    oxyCODONE (ROXICODONE) 10 mg immediate release tablet 120 tablet 0 05/04/2023 06/03/2023    Sig - Route: Take 1 tablet (10 mg total) by mouth every six (6) hours as needed for pain - Oral    Sent to pharmacy as: oxyCODONE 10 mg tablet (ROXICODONE)    Earliest Fill Date: 05/04/2023    Notes to Pharmacy: OK to fill 1 day early if Rx fill date falls on Sunday or holiday.       Disp Refills Start End    oxyCODONE (ROXICODONE) 10 mg immediate release tablet 120 tablet 0 04/04/2023 05/04/2023    Sig - Route: Take 1 tablet (10 mg total) by mouth every six (6) hours as needed for pain - Oral    Sent to pharmacy as: oxyCODONE 10 mg tablet (ROXICODONE)    Earliest Fill Date: 04/04/2023    Notes to Pharmacy: OK to fill 1 day early if Rx fill date falls on Sunday or holiday.      oxyCODONE (ROXICODONE) 10 mg immediate release tablet 120 tablet 0 03/03/2023 04/02/2023    Sig - Route: Take 1 tablet (10 mg total) by mouth every six (6) hours as needed for pain. DNF 03/03/23 (2 days early due to travel) - Oral    Sent to pharmacy as: oxyCODONE 10 mg tablet (ROXICODONE)    Earliest Fill Date: 03/03/2023    Notes to Pharmacy: OK to fill 1 day early if Rx fill date falls on Sunday or holiday.

## 2023-03-03 NOTE — Unmapped (Unsigned)
Chronic Pain Follow Up Note    Assessment and Plan:  Carla Evans is a 62 y.o. being followed at [facility] for complaint of chronic lower back pain 2/2 lumbar post-laminectomy syndrome and lumbar spinal canal stenosis at L3-L4. We have continued to encourage the patient to see spine surgery as she reports continued lower back and bilateral lower extremity discomfort consistent with neurogenic claudication. The patient also reports persistent right lower extremity weakness. She has previously had injections, including LESI and facet injections, with no benefit (at previous pain practice). Patient has a long standing hx of GYN related urinary incontinence. The patient has historically been hesitant about undergoing surgical evaluation though recently more amenable especially now in light of worsening pain and symptoms.     1. Lumbar post-laminectomy syndrome/Lumbar spinal stenosis  Patient presents with persistent lumbar radicular symptoms and is concerned for progression of disease. She denies red flag symptoms, but does endorse gait changes and imbalance when walking. MRI lumbar results on 10/07/22 showed presence of severe spinal canal narrowing and moderate bilateral neuroforaminal narrowing at the L3-L4 level.   - Continue current medications: Oxycontin 60 mg every 8 hours and Oxycodone 10 mg every 6 hours.  - Refill prescription for Tizanidine (Zanaflex) for muscle relaxation.  - Encourage scheduling and attendance at an appointment with a spine surgeon to discuss options, prognosis, and potential need for surgery    2. Cervical spinal canal stenosis and cervical radiculopathy; Cervical MRI on 10/07/22 showing presence of severe spinal canal narrowing and moderate bilateral neuro foraminal narrowing at the level of C4-C5. Patient continues on OxyContin 60 mg TID and Oxycodone 10 mg q6h prn with benefit. We will continue her opioid medications without changes.   - Patient encouraged to discuss treatment plan with spine surgery during her future appointment  - Continue Oxycontin 60 mg TID, refilled  - Continue Oxycodone 10 mg q6h prn, refilled,  - Continue Amitiza prn   - UDS/treatment agreement up to date     2. Right Foot Pain  - No MRI evidence of L5 or S1 nerve root compression - with evidence of pain on weight bearing pain likely non-spinal in nature  - Refer to a local podiatrist for evaluation and management.  - Monitor for any changes or worsening of symptoms.    3. Asthma and Smoking  - Encourage cessation of smoking due to potential impact on surgical outcomes and overall health.  - Continue with the current asthma management plan.    4. Gastroesophageal Reflux Disease (GERD)  - Continue Protonix as prescribed by PCP  - Avoid ibuprofen for back pain to prevent exacerbation of GERD symptoms.    5. Opioid induced constipation  - Note improvement with the consumption of prunes.  - No longer on Amitiza    The patient has an active treatment agreement on file, the patient's last urine toxicology screen was appropriate and it was within the last 12 months, patient brought medications available for pill counts to today's visit and the pill counts were appropriate, the [place] database was reviewed and is appropriate, the patient denies any misuse abuse or diversion of medication and reports an medication significantly improve patient's quality of life as well as functionality status. The patient denies any medication associated side effects and wishes to continue on medications as currently prescribed. Risks and benefits of opiate medications were once again discussed with patient and patient describes her understanding.    I have reviewed the [facility] Medical  Board statement on use of controlled substances for the treatment of pain as well as the CDC Guideline for Prescribing Opioids for Chronic Pain. I have reviewed the [place] Controlled Substance Monitoring Database.    Medication Monitoring:   PDMP reviewed: 03/09/23  Last urine toxicology: 12/01/22 - appropriate, sample given 3/20  Last narcotic agreement: 12/01/22  Previous Compliance Issues: Did not bring Oxycodone for pill count 04/03/18, UDS inappropriately positive for hydrocodone on 02/21/20   Naloxone ordered: Yes  Total morphine equivalents: > 300  Benzodiazepine: No  ORT: 0 on 04/03/18    Instruct the patient to follow up as needed for any changes in symptoms or concerns. Encourage attendance at scheduled appointments with specialists for comprehensive care.    Requested Prescriptions     Signed Prescriptions Disp Refills    oxyCODONE (ROXICODONE) 10 mg immediate release tablet 120 tablet 0     Sig: Take 1 tablet (10 mg total) by mouth every six (6) hours as needed for pain. DNF till 03/03/23 (refill 2 days early due to travel)    oxyCODONE (OXYCONTIN) 60 mg 12 hr crush resistant ER/CR tablet 90 tablet 0     Sig: Take 1 tablet (60 mg total) by mouth every eight (8) hours.    oxyCODONE (OXYCONTIN) 60 mg 12 hr crush resistant ER/CR tablet 90 tablet 0     Sig: Take 1 tablet (60 mg total) by mouth every eight (8) hours. DNF till 04/04/23    oxyCODONE (ROXICODONE) 10 mg immediate release tablet 120 tablet 0     Sig: Take 1 tablet (10 mg total) by mouth every six (6) hours as needed for pain    oxyCODONE (ROXICODONE) 10 mg immediate release tablet 120 tablet 0     Sig: Take 1 tablet (10 mg total) by mouth every six (6) hours as needed for pain    oxyCODONE (ROXICODONE) 10 mg immediate release tablet 120 tablet 0     Sig: Take 1 tablet (10 mg total) by mouth every six (6) hours as needed for pain. DNF 03/03/23 (2 days early due to travel)    oxyCODONE (OXYCONTIN) 60 mg 12 hr crush resistant ER/CR tablet 90 tablet 0     Sig: Take 1 tablet (60 mg total) by mouth every eight (8) hours. DNF till 03/03/23 (refill 2 days early due to travel)    tizanidine (ZANAFLEX) 4 MG tablet 90 tablet 2     Sig: Take 1 tablet (4 mg total) by mouth every eight (8) hours as needed.    naloxone (NARCAN) 4 mg nasal spray 2 each 0     Sig: One spray in either nostril once for known/suspected opioid overdose. May repeat every 2-3 minutes in alternating nostril til EMS arrives     Orders Placed This Encounter   Procedures    Ambulatory referral to Podiatry     Standing Status:   Future     Standing Expiration Date:   03/02/2024     Referral Priority:   Routine     Referral Type:   Generic Referral     Referral Reason:   Specialty Services Required     Number of Visits Requested:   1     HPI:  Carla Evans is a 62 y.o. being followed at [facility] for complaint of chronic lower back pain 2/2 lumbar post-laminectomy syndrome.     At last visit in March, patient reported that her mobility had been an issue. She had  been experiencing bilateral lower extremity radicular pain starting from her lumbar region. She had noticed foot weakness on the right side where her foot would occasionally catch when she was walking causing her trip and fall. She reported that she had fallen roughly 4 times in the last six months. She had a lot of numbness in the right foot that she believed was contributing to the falls. She did not lose consciousness or experience light headiness or dizziness before she falls. She had not experience any sensation changes in the lower extremities. Reported that she was finding relief with her pain mediations and not experiencing any side effects. She had not done any recent PT.     Since last visit, she has obtained updated MRI Cervical and Lumbar spine imaging and reports that she is planning to but has not engaged with spine surgery yet. .     The patient presents today with worsening pain in her right foot, as reported during the last visit, this is most prominent on the lateral aspect of the foot. She describes the pain as achy and throbbing, exacerbated by weight-bearing activities. The patient denies any recent injury or trauma to the foot. Additionally, she experiences numbness radiating down her leg into the foot, which worsens with prolonged standing or walking. The patient is unsure if the foot pain arises from her back issues but there is no evidence of L5 nerve root compression on the most recent MRI which was reviewed with the patient today.    The patient is currently prescribed Oxycontin 60 mg every 8 hours and Oxycodone 10 mg every 6 hours for pain management. She also takes Tizanidine as a muscle relaxant. The patient reports no issues with her current medications and denies any sedation. She has a history of constipation but has been managing it through dietary changes, including prune consumption.    The patient has an upcoming appointment scheduled with a spine surgeon to discuss treatment options. She has a history of asthma and continues to smoke, which may pose a problem if surgery is considered.     The patient also has a history of reflux and heartburn but reports that she has stopped using ibuprofen for her back pain.      Current medication regimen:  - Oxycontin 60 mg TID  - Oxycodone 10 mg q6h PRN (4x daily)  - Tizanidine 4 mg q8h prn   - Amitiza every day - occasionally    The patient states her pain is located arms, legs, neck, back, upper back, shoulders and the severity of her pain ranges from 7/10 to 10/10.  Her pain currently is 7/10 and on average is 8/10.  She describes the sensation of her pain as aching, burning, pressing, shooting, sore, tender. Her pain is present all of the time and worst mornings, middle of the night. The patient???s pain impacts enjoyment of life, general activity, mood, normal work, recreational activities, sleep, walking, sitting, standing. Her interval history includes None. Her pain is worse, and she does have new pain to discuss today. Pain in right foot. She is not on blood thinners or anti-coagulants. In regards to medications currently taken for pain management, the patient is tolerating these medications well and complains of associated side effects: agitation.    Patient denies homicidal/suicidal ideation.    Allergies  Allergies   Allergen Reactions    Celebrex [Celecoxib] Swelling    Latex Shortness Of Breath and Rash    Latex, Natural Rubber Shortness  Of Breath and Rash    Vioxx [Rofecoxib] Swelling     Severe facial swelling. my face swelled up like a basketball    Xtampza Er [Oxycodone Myristate] Swelling     throat started to close burned neck    Augmentin [Amoxicillin-Pot Clavulanate] Rash    Other      Due to eczema on hands, unable to touch raw fruits and vegetables and most perfume soaps and lotions. Causes blisters, breaking of skin, and bleeding.   Pt reports she can eat raw friut and vegetables, just can't touch them with her hands.    Adhesive Rash       Home Medications    Current Outpatient Medications   Medication Sig Dispense Refill    albuterol (PROVENTIL HFA;VENTOLIN HFA) 90 mcg/actuation inhaler Inhale 2 puffs daily as needed.      apremilast (OTEZLA STARTER) 10 mg (4)-20 mg (4)-30 mg (47) DsPk Take 10mg  once on day 1 (d1), 10mg  2x/day (d2), 10mg  in AM and 20mg  in PM (d3), 20mg  2x/day (d4), 20mg  in AM and 30mg  in PM (d5), and 30mg  2x/day after that 55 tablet 0    apremilast (OTEZLA) 30 mg Tab Take 1 tablet by mouth twice a day to treat rash on hands 60 tablet 11    cetirizine (ZYRTEC) 10 MG tablet Take 1 tablet (10 mg total) by mouth daily.      clobetasol (TEMOVATE) 0.05 % ointment Apply topically two (2) times a day. 30 g 2    COURIERED MED OR SUPPLY [id number], Dupixent, 300mg /70ml, subcutaneous injection.   [id number], Sharps Container, 1 box. 1 each 0    cyanocobalamin, vitamin B-12, 1,000 mcg/mL injection Frequency:Q2W   Dosage:1   ML  Instructions:  Note:Dose:      dupilumab 300 mg/2 mL PnIj Inject the contents of 1 pen (300 mg) under the skin every fourteen (14) days to treat rash. 4 mL 11    dupilumab 300 mg/2 mL PnIj Inject the contents of 2 pens (600mg ) under the skin for loading dose 6 mL 0    empty container Misc Use as directed 1 each 3    fluticasone propionate (FLONASE) 50 mcg/actuation nasal spray 1 spray into each nostril daily.      fluticasone propionate (FLOVENT HFA) 220 mcg/actuation inhaler Inhale 2 puffs daily as needed. Frequency:BID   Dosage:220   MCG  Instructions:  Note:Dose:      hydrOXYzine (ATARAX) 25 MG tablet Take 1 tablet (25 mg total) by mouth two (2) times a day as needed. 180 tablet 0    ibuprofen (ADVIL,MOTRIN) 200 MG tablet Take 1 tablet (200 mg total) by mouth every six (6) hours as needed for pain. 2 tabs as needed      lubiprostone (AMITIZA) 24 MCG capsule Take 1 capsule (24 mcg total) by mouth daily as needed. 90 capsule 0    oxyCODONE (OXYCONTIN) 60 mg 12 hr crush resistant ER/CR tablet Take 1 tablet (60 mg total) by mouth every eight (8) hours. 90 tablet 0    oxyCODONE (OXYCONTIN) 60 mg 12 hr crush resistant ER/CR tablet Take 1 tablet (60 mg total) by mouth every eight (8) hours. 90 tablet 0    pantoprazole (PROTONIX) 40 MG tablet Take 1 tablet (40 mg total) by mouth daily.      promethazine (PHENERGAN) 25 MG tablet Take 1 tablet (25 mg total) by mouth once as needed.      salmeterol (SEREVENT) 50 mcg/dose diskus inhaler Inhale 1 puff.  tiotropium (SPIRIVA) 18 mcg inhalation capsule Place 1 capsule (18 mcg total) into inhaler and inhale.      naloxone (NARCAN) 4 mg nasal spray One spray in either nostril once for known/suspected opioid overdose. May repeat every 2-3 minutes in alternating nostril til EMS arrives 2 each 0    [START ON 05/04/2023] oxyCODONE (OXYCONTIN) 60 mg 12 hr crush resistant ER/CR tablet Take 1 tablet (60 mg total) by mouth every eight (8) hours. 90 tablet 0    [START ON 04/04/2023] oxyCODONE (OXYCONTIN) 60 mg 12 hr crush resistant ER/CR tablet Take 1 tablet (60 mg total) by mouth every eight (8) hours. DNF till 04/04/23 90 tablet 0    oxyCODONE (OXYCONTIN) 60 mg 12 hr crush resistant ER/CR tablet Take 1 tablet (60 mg total) by mouth every eight (8) hours. DNF till 03/03/23 (refill 2 days early due to travel) 90 tablet 0    oxyCODONE (ROXICODONE) 10 mg immediate release tablet Take 1 tablet (10 mg total) by mouth every six (6) hours as needed for pain. DNF till 03/03/23 (refill 2 days early due to travel) 120 tablet 0    [START ON 05/04/2023] oxyCODONE (ROXICODONE) 10 mg immediate release tablet Take 1 tablet (10 mg total) by mouth every six (6) hours as needed for pain 120 tablet 0    [START ON 04/04/2023] oxyCODONE (ROXICODONE) 10 mg immediate release tablet Take 1 tablet (10 mg total) by mouth every six (6) hours as needed for pain 120 tablet 0    oxyCODONE (ROXICODONE) 10 mg immediate release tablet Take 1 tablet (10 mg total) by mouth every six (6) hours as needed for pain. DNF 03/03/23 (2 days early due to travel) 120 tablet 0    tizanidine (ZANAFLEX) 4 MG tablet Take 1 tablet (4 mg total) by mouth every eight (8) hours as needed. 90 tablet 2     No current facility-administered medications for this visit.     Imaging:  MRI Cervical Spine 10/07/22  Impression   Moderate to marked multilevel degenerative changes throughout the cervical spine, most prominently;   1. Severe spinal canal narrowing and moderate bilateral neuroforaminal narrowing at the C4-5 level. Mildly increased T2 signal abnormality of the spinal cord at this level, possibly reflecting myomalacia.   2. Moderate spinal canal narrowing at C3-4 and C5-6.       MRI Lumbar Spine 10/07/22  Postsurgical changes from laminectomy at L4-L5 and L5-S1, and posterior spinal fusion hardware from L4-L5, with associated metal artifact.      Multilevel degenerative changes, most prominently severe spinal canal narrowing and moderate bilateral neuroforaminal narrowing at the L3-L4 level. Additional details above.        Narrative   EXAM: Magnetic resonance imaging, spinal canal and contents, lumbar, without contrast material.   DATE: 10/07/2022 4:03 PM   ACCESSION: [id number]   DICTATED: 10/07/2022 4:20 PM   INTERPRETATION LOCATION: 54 IC      CLINICAL INDICATION: 62 years old Female with Back pain/LLE symptoms worsening; concern for worsening stenosis  - M96.1 - Lumbar post - laminectomy syndrome - M54.16 - Lumbar radiculopathy        COMPARISON: MRI lumbar spine 01/29/2015      TECHNIQUE: Multiplanar MRI was performed through the lumbar spine without intravenous contrast.      FINDINGS:   Posterior spinal fusion hardware at the L4 and L5 level with associated metal artifact. Postlaminectomy changes at L4-L5 and L5-S1.      Degenerative bone marrow signal at the  L3-L4 level. The visualized cord is unremarkable and the conus medullaris ends at a normal level.      Grade 1 retrolisthesis L2 on L3 and grade 1 anterolisthesis L3 on L4. Multilevel disc desiccation.          T12-L1: Facet arthropathy. No significant spinal canal or neural foraminal narrowing.      L1-L2: Ligamentum flavum hypertrophy and facet arthropathy. No significant spinal canal or neural foraminal narrowing.      L2-L3: Posterior disc bulge, ligamentum flavum hypertrophy, and facet arthropathy. Moderate spinal canal narrowing. Mild bilateral neuroforaminal narrowing.      L3-L4: Posterior disc bulge, ligamentum flavum hypertrophy, and facet arthropathy. Severe spinal canal narrowing, with crowding of the cauda equina nerve roots. Moderate bilateral neuroforaminal narrowing.      L4-L5: Postsurgical changes. No significant spinal canal narrowing. Mild bilateral neuroforaminal narrowing.      L5-S1: Posterior disc bulge and postsurgical changes. No significant spinal canal narrowing. Mild right neuroforaminal narrowing. Moderate left neuroforaminal narrowing.      The paraspinal tissues are within normal limits.      For the purposes of this dictation, the lowest well formed intervertebral disc space is assumed to be the L5-S1 level, and there are presumed to be five lumbar-type vertebral bodies.           Review Of Systems:  General night sweats, fatigue, difficulty sleeping  Cardiovascular shortness of breath with exertion  Gastrointestinal heartburn and gastric reflux  Skin redness  Endocrine increased sweating, excessive thirst, hot flashes, excess urination, change in hair or skin texture, thinning hair, intolerance to heat or cold, decreased sexual performance, difficulties with orgasm  Musculoskeletal joint aches/swelling, spasms/spacticity/cramps, back pain, muscle aches/weakness  Neurologic coordination difficulty, migraines/headaches, numbness/tingling  Psychiatric anxiety, low concentration, low energy, agitation, lack of interest in activities, feeling hopeless/helpless      Physical Exam:  VITALS:   Vitals:    03/03/23 1432   BP: 155/74   Pulse: 81   Resp: 16   Temp: 36.7 ??C (98.1 ??F)   SpO2: 96%         Wt Readings from Last 3 Encounters:   03/03/23 70.9 kg (156 lb 6.4 oz)   12/01/22 72.5 kg (159 lb 12.8 oz)   09/01/22 72.2 kg (159 lb 1.6 oz)     GENERAL:  The patient is well developed, well-nourished, and appears to be in no apparent distress.   HEAD/NECK:    Normocephalic/atraumatic. clear sclera, pupils not pinpoint  CV:  Warm,well perfused  LUNGS:   Normal work of breathing, no supplemental O2  EXTREMITIES:  No clubbing, cyanosis noted.  NEUROLOGIC:    The patient is alert and oriented, speech fluent, normal language.   MUSCULOSKELETAL:    Motor function  preserved. Good range of motion of all extremities.     - Right foot pain exacerbated by weight-bearing.    - right foot tenderness  GAIT:  The patient rises from a seated position with no difficulty and ambulates with nonantalgic gait without the assistance of a walking aid.   SKIN:   No obvious rashes, lesions, or erythema.  PSY:   Appropriate affect. No overt pain behaviors. No evidence of psychomotor retardation or agitation, no signs of intoxication. or agitation, no signs of intoxication.

## 2023-03-14 MED FILL — DUPIXENT 300 MG/2 ML SUBCUTANEOUS PEN INJECTOR: SUBCUTANEOUS | 28 days supply | Qty: 4 | Fill #1

## 2023-04-04 MED ORDER — OXYCODONE 10 MG TABLET
ORAL_TABLET | Freq: Four times a day (QID) | ORAL | 0 refills | 30.00000 days | Status: CP | PRN
Start: 2023-04-04 — End: 2023-05-04

## 2023-04-04 MED ORDER — OXYCODONE ER 60 MG TABLET,CRUSH RESISTANT,EXTENDED RELEASE 12 HR
ORAL_TABLET | Freq: Three times a day (TID) | ORAL | 0 refills | 30.00000 days | Status: CP
Start: 2023-04-04 — End: 2023-03-03

## 2023-05-04 DIAGNOSIS — M961 Postlaminectomy syndrome, not elsewhere classified: Principal | ICD-10-CM

## 2023-05-04 MED ORDER — OXYCODONE ER 60 MG TABLET,CRUSH RESISTANT,EXTENDED RELEASE 12 HR
ORAL_TABLET | Freq: Three times a day (TID) | ORAL | 0 refills | 30.00000 days | Status: CP
Start: 2023-05-04 — End: 2023-06-03

## 2023-05-04 MED ORDER — OXYCODONE 10 MG TABLET
ORAL_TABLET | Freq: Four times a day (QID) | ORAL | 0 refills | 30 days | Status: CP | PRN
Start: 2023-05-04 — End: 2023-06-03

## 2023-05-04 NOTE — Unmapped (Signed)
[facility] Refill Coordination Note    Specialty Medication(s) to be Shipped:   Inflammatory Disorders: Dupixent    Other medication(s) to be shipped: No additional medications requested for fill at this time     Carla Evans, DOB: 02-Nov-1960  Phone: [phone] (home) [phone] (work)      All above HIPAA information was verified with patient.     Was a Nurse, learning disability used for this call? No    Completed refill call assessment today to schedule patient's medication shipment from the [facility] [phone]).  All relevant notes have been reviewed.     Specialty medication(s) and dose(s) confirmed: Regimen is correct and unchanged.   Changes to medications: Chey reports no changes at this time.  Changes to insurance: No  New side effects reported not previously addressed with a pharmacist or physician: None reported  Questions for the pharmacist: No    Confirmed patient received a Conservation officer, historic buildings and a Surveyor, mining with first shipment. The patient will receive a drug information handout for each medication shipped and additional FDA Medication Guides as required.       DISEASE/MEDICATION-SPECIFIC INFORMATION        For patients on injectable medications: Patient currently has 1 doses left.  Next injection is scheduled for 8/21.    SPECIALTY MEDICATION ADHERENCE     Medication Adherence    Patient reported X missed doses in the last month: 0  Specialty Medication: (Dupixent) 300 mg/2 mL PnIj  Patient is on additional specialty medications: No              Were doses missed due to medication being on hold? No    DUPIXENT PEN 300 mg/2 mL Pnij (dupilumab)  : 14 days of medicine on hand       REFERRAL TO PHARMACIST     Referral to the pharmacist: Not needed      [facility]     Shipping address confirmed in Epic.       Delivery Scheduled: Yes, Expected medication delivery date: 8/28.     Medication will be delivered via UPS to the prescription address in Epic WAM.    Gaspar Cola Shared [facility] Specialty Technician

## 2023-05-05 MED ORDER — OXYCODONE ER 60 MG TABLET,CRUSH RESISTANT,EXTENDED RELEASE 12 HR
ORAL_TABLET | Freq: Three times a day (TID) | ORAL | 0 refills | 30 days
Start: 2023-05-05 — End: ?

## 2023-05-05 NOTE — Unmapped (Signed)
Addended by: Sibyl Parr B on: 05/04/2023 06:34 PM     Modules accepted: Orders

## 2023-05-06 NOTE — Unmapped (Signed)
Addressed by Dr. Patel

## 2023-05-10 MED FILL — DUPIXENT 300 MG/2 ML SUBCUTANEOUS PEN INJECTOR: SUBCUTANEOUS | 28 days supply | Qty: 4 | Fill #2

## 2023-06-02 NOTE — Unmapped (Unsigned)
Chronic Pain Follow Up Note    Assessment and Plan:  Carla Evans is a 62 y.o. being followed at [facility] clinic for complaint of chronic lower back pain 2/2 lumbar post-laminectomy syndrome and lumbar spinal canal stenosis at L3-L4. We have continued to encourage the patient to see spine surgery as she reports continued lower back and bilateral lower extremity discomfort consistent with neurogenic claudication. The patient also reports persistent right lower extremity weakness. She has previously had injections, including LESI and facet injections, with no benefit (at previous pain practice). Patient has a long standing hx of GYN related urinary incontinence. The patient has historically been hesitant about undergoing surgical evaluation though recently more amenable especially now in light of worsening pain and symptoms.     1. Lumbar post-laminectomy syndrome/Lumbar spinal stenosis  ***  Patient presents with persistent lumbar radicular symptoms and is concerned for progression of disease. She denies red flag symptoms, but does endorse gait changes and imbalance when walking. MRI lumbar results on 10/07/22 showed presence of severe spinal canal narrowing and moderate bilateral neuroforaminal narrowing at the L3-L4 level.   - Continue current medications: Oxycontin 60 mg every 8 hours and Oxycodone 10 mg every 6 hours.  - Refill prescription for Tizanidine (Zanaflex) for muscle relaxation.  - Encourage scheduling and attendance at an appointment with a spine surgeon to discuss options, prognosis, and potential need for surgery    2. Cervical spinal canal stenosis and cervical radiculopathy;   ***  Cervical MRI on 10/07/22 showing presence of severe spinal canal narrowing and moderate bilateral neuro foraminal narrowing at the level of C4-C5. Patient continues on OxyContin 60 mg TID and Oxycodone 10 mg q6h prn with benefit. We will continue her opioid medications without changes.   - Patient encouraged to discuss treatment plan with spine surgery during her future appointment  - Continue Oxycontin 60 mg TID, refilled  - Continue Oxycodone 10 mg q6h prn, refilled,  - Continue Amitiza prn   - UDS/treatment agreement up to date     2. Right Foot Pain  ***  - No MRI evidence of L5 or S1 nerve root compression - with evidence of pain on weight bearing pain likely non-spinal in nature  - Refer to a local podiatrist for evaluation and management.  - Monitor for any changes or worsening of symptoms.    3. Asthma and Smoking  ***  - Encourage cessation of smoking due to potential impact on surgical outcomes and overall health.  - Continue with the current asthma management plan.    4. Gastroesophageal Reflux Disease (GERD)  ***  - Continue Protonix as prescribed by PCP  - Avoid ibuprofen for back pain to prevent exacerbation of GERD symptoms.    5. Opioid induced constipation  ***  - Note improvement with the consumption of prunes.  - No longer on Amitiza    The patient has an active treatment agreement on file, the patient's last urine toxicology screen was appropriate and it was within the last 12 months, patient brought medications available for pill counts to today's visit and the pill counts were appropriate, the [place] database was reviewed and is appropriate, the patient denies any misuse abuse or diversion of medication and reports an medication significantly improve patient's quality of life as well as functionality status. The patient denies any medication associated side effects and wishes to continue on medications as currently prescribed. Risks and benefits of opiate medications were once again discussed with patient  and patient describes her understanding.    I have reviewed the [facility] Medical Board statement on use of controlled substances for the treatment of pain as well as the CDC Guideline for Prescribing Opioids for Chronic Pain. I have reviewed the [place] Controlled Substance Monitoring Database.    Medication Monitoring:   PDMP reviewed: 06/02/23  Last urine toxicology: 12/01/22 - appropriate, sample given 3/20  Last narcotic agreement: 12/01/22  Previous Compliance Issues: Did not bring Oxycodone for pill count 04/03/18, UDS inappropriately positive for hydrocodone on 02/21/20   Naloxone ordered: Yes  Total morphine equivalents: > 300  Benzodiazepine: No  ORT: 0 on 04/03/18    Instruct the patient to follow up as needed for any changes in symptoms or concerns. Encourage attendance at scheduled appointments with specialists for comprehensive care.    Requested Prescriptions      No prescriptions requested or ordered in this encounter     No orders of the defined types were placed in this encounter.    HPI:  Carla Evans is a 62 y.o. being followed at [facility] clinic for complaint of chronic lower back pain 2/2 lumbar post-laminectomy syndrome.     The patient was last seen in June, at which point the patient reported  persistent lumbar radicular symptoms and was concerned for progression of disease. She denied red flag symptoms, but did endorse gait changes and imbalance when walking. Encouraged scheduling and attendance at an appointment with a spine surgeon to discuss options, prognosis, and potential need for surgery.  Patient continued on OxyContin 60 mg TID and Oxycodone 10 mg q6h prn with benefit. We continued her opioid medications without changes. For right foot pain, there was no MRI evidence of L5 or S1 nerve root compression - with evidence of pain on weight bearing pain likely non-spinal in nature. Encouraged cessation of smoking due to potential impact on surgical outcomes and overall health. Continued Protonix as prescribed by PCP. Note improvement of opioid induced constipation with the consumption of prunes.    Since last visit, no notable history.     Today, ***      Current medication regimen:  - Oxycontin 60 mg TID  - Oxycodone 10 mg q6h PRN (4x daily)  - Tizanidine 4 mg q8h prn   - Amitiza every day - occasionally    The patient states her pain is located *** and the severity of her pain ranges from ***/10 to ***/10.  Her pain currently is ***/10 and on average is ***/10.  She describes the sensation of her pain as {pain described:58928}. Her pain is present {pain frequency:58931} and worst {pain worst:59685}. The patient???s pain impacts {negatively affected:59709}. Her interval history includes {interval history:59710}. Her pain {pain changed:59711}, and she {does does not blank:47747} have new pain to discuss today. She {is/is not:36772} on blood thinners or anti-coagulants. In regards to medications currently taken for pain management, the patient {is:54246::is} tolerating these medications well and complains of associated side effects: {pain med side effects:59712}.        Patient denies homicidal/suicidal ideation.    Allergies  Allergies   Allergen Reactions    Celebrex [Celecoxib] Swelling    Latex Shortness Of Breath and Rash    Latex, Natural Rubber Shortness Of Breath and Rash    Vioxx [Rofecoxib] Swelling     Severe facial swelling. my face swelled up like a basketball    Xtampza Er [Oxycodone Myristate] Swelling     throat started to close  burned neck    Augmentin [Amoxicillin-Pot Clavulanate] Rash    Other      Due to eczema on hands, unable to touch raw fruits and vegetables and most perfume soaps and lotions. Causes blisters, breaking of skin, and bleeding.   Pt reports she can eat raw friut and vegetables, just can't touch them with her hands.    Adhesive Rash       Home Medications    Current Outpatient Medications   Medication Sig Dispense Refill    albuterol (PROVENTIL HFA;VENTOLIN HFA) 90 mcg/actuation inhaler Inhale 2 puffs daily as needed.      apremilast (OTEZLA STARTER) 10 mg (4)-20 mg (4)-30 mg (47) DsPk Take 10mg  once on day 1 (d1), 10mg  2x/day (d2), 10mg  in AM and 20mg  in PM (d3), 20mg  2x/day (d4), 20mg  in AM and 30mg  in PM (d5), and 30mg  2x/day after that 55 tablet 0    apremilast (OTEZLA) 30 mg Tab Take 1 tablet by mouth twice a day to treat rash on hands 60 tablet 11    cetirizine (ZYRTEC) 10 MG tablet Take 1 tablet (10 mg total) by mouth daily.      clobetasol (TEMOVATE) 0.05 % ointment Apply topically two (2) times a day. 30 g 2    COURIERED MED OR SUPPLY [id number], Dupixent, 300mg /71ml, subcutaneous injection.   [id number], Sharps Container, 1 box. 1 each 0    cyanocobalamin, vitamin B-12, 1,000 mcg/mL injection Frequency:Q2W   Dosage:1   ML  Instructions:  Note:Dose:      dupilumab 300 mg/2 mL PnIj Inject the contents of 1 pen (300 mg) under the skin every fourteen (14) days to treat rash. 4 mL 11    dupilumab 300 mg/2 mL PnIj Inject the contents of 2 pens (600mg ) under the skin for loading dose 6 mL 0    empty container Misc Use as directed 1 each 3    fluticasone propionate (FLONASE) 50 mcg/actuation nasal spray 1 spray into each nostril daily.      fluticasone propionate (FLOVENT HFA) 220 mcg/actuation inhaler Inhale 2 puffs daily as needed. Frequency:BID   Dosage:220   MCG  Instructions:  Note:Dose:      hydrOXYzine (ATARAX) 25 MG tablet Take 1 tablet (25 mg total) by mouth two (2) times a day as needed. 180 tablet 0    ibuprofen (ADVIL,MOTRIN) 200 MG tablet Take 1 tablet (200 mg total) by mouth every six (6) hours as needed for pain. 2 tabs as needed      lubiprostone (AMITIZA) 24 MCG capsule Take 1 capsule (24 mcg total) by mouth daily as needed. 90 capsule 0    naloxone (NARCAN) 4 mg nasal spray One spray in either nostril once for known/suspected opioid overdose. May repeat every 2-3 minutes in alternating nostril til EMS arrives 2 each 0    oxyCODONE (OXYCONTIN) 60 mg 12 hr crush resistant ER/CR tablet Take 1 tablet (60 mg total) by mouth every eight (8) hours. 90 tablet 0    oxyCODONE (OXYCONTIN) 60 mg 12 hr crush resistant ER/CR tablet Take 1 tablet (60 mg total) by mouth every eight (8) hours. 90 tablet 0    oxyCODONE (OXYCONTIN) 60 mg 12 hr crush resistant ER/CR tablet Take 1 tablet (60 mg total) by mouth every eight (8) hours. 90 tablet 0    oxyCODONE (OXYCONTIN) 60 mg 12 hr crush resistant ER/CR tablet Take 1 tablet (60 mg total) by mouth every eight (8) hours. DNF till 04/04/23 90 tablet 0  oxyCODONE (OXYCONTIN) 60 mg 12 hr crush resistant ER/CR tablet Take 1 tablet (60 mg total) by mouth every eight (8) hours. DNF till 03/03/23 (refill 2 days early due to travel) 90 tablet 0    oxyCODONE (OXYCONTIN) 60 mg 12 hr crush resistant ER/CR tablet Take 1 tablet (60 mg total) by mouth every eight (8) hours. 90 tablet 0    oxyCODONE (ROXICODONE) 10 mg immediate release tablet Take 1 tablet (10 mg total) by mouth every six (6) hours as needed for pain 120 tablet 0    pantoprazole (PROTONIX) 40 MG tablet Take 1 tablet (40 mg total) by mouth daily.      promethazine (PHENERGAN) 25 MG tablet Take 1 tablet (25 mg total) by mouth once as needed.      salmeterol (SEREVENT) 50 mcg/dose diskus inhaler Inhale 1 puff.      tiotropium (SPIRIVA) 18 mcg inhalation capsule Place 1 capsule (18 mcg total) into inhaler and inhale.      tizanidine (ZANAFLEX) 4 MG tablet Take 1 tablet (4 mg total) by mouth every eight (8) hours as needed. 90 tablet 2     No current facility-administered medications for this visit.     Imaging:  MRI Cervical Spine 10/07/22  Impression   Moderate to marked multilevel degenerative changes throughout the cervical spine, most prominently;   1. Severe spinal canal narrowing and moderate bilateral neuroforaminal narrowing at the C4-5 level. Mildly increased T2 signal abnormality of the spinal cord at this level, possibly reflecting myomalacia.   2. Moderate spinal canal narrowing at C3-4 and C5-6.       MRI Lumbar Spine 10/07/22  Postsurgical changes from laminectomy at L4-L5 and L5-S1, and posterior spinal fusion hardware from L4-L5, with associated metal artifact.      Multilevel degenerative changes, most prominently severe spinal canal narrowing and moderate bilateral neuroforaminal narrowing at the L3-L4 level. Additional details above.        Narrative   EXAM: Magnetic resonance imaging, spinal canal and contents, lumbar, without contrast material.   DATE: 10/07/2022 4:03 PM   ACCESSION: [id number]   DICTATED: 10/07/2022 4:20 PM   INTERPRETATION LOCATION: [facility]      CLINICAL INDICATION: 62 years old Female with Back pain/LLE symptoms worsening; concern for worsening stenosis  - M96.1 - Lumbar post - laminectomy syndrome - M54.16 - Lumbar radiculopathy        COMPARISON: MRI lumbar spine 01/29/2015      TECHNIQUE: Multiplanar MRI was performed through the lumbar spine without intravenous contrast.      FINDINGS:   Posterior spinal fusion hardware at the L4 and L5 level with associated metal artifact. Postlaminectomy changes at L4-L5 and L5-S1.      Degenerative bone marrow signal at the L3-L4 level. The visualized cord is unremarkable and the conus medullaris ends at a normal level.      Grade 1 retrolisthesis L2 on L3 and grade 1 anterolisthesis L3 on L4. Multilevel disc desiccation.          T12-L1: Facet arthropathy. No significant spinal canal or neural foraminal narrowing.      L1-L2: Ligamentum flavum hypertrophy and facet arthropathy. No significant spinal canal or neural foraminal narrowing.      L2-L3: Posterior disc bulge, ligamentum flavum hypertrophy, and facet arthropathy. Moderate spinal canal narrowing. Mild bilateral neuroforaminal narrowing.      L3-L4: Posterior disc bulge, ligamentum flavum hypertrophy, and facet arthropathy. Severe spinal canal narrowing, with crowding of the cauda equina nerve roots.  Moderate bilateral neuroforaminal narrowing.      L4-L5: Postsurgical changes. No significant spinal canal narrowing. Mild bilateral neuroforaminal narrowing.      L5-S1: Posterior disc bulge and postsurgical changes. No significant spinal canal narrowing. Mild right neuroforaminal narrowing. Moderate left neuroforaminal narrowing. The paraspinal tissues are within normal limits.      For the purposes of this dictation, the lowest well formed intervertebral disc space is assumed to be the L5-S1 level, and there are presumed to be five lumbar-type vertebral bodies.           Review Of Systems:  General {ajlrosgen:46920}  Cardiovascular {aggROSCV:37303}  Gastrointestinal {aggROSGI:37304}  Skin {aggROSskin:37305}  Endocrine {aggROSendo:37306}  Musculoskeletal {aggROSmusk:37307}  Neurologic {aggROSneu:37308}  Psychiatric {aggROSpsych:37309}    Physical Exam:  VITALS:   There were no vitals filed for this visit.        Wt Readings from Last 3 Encounters:   03/03/23 70.9 kg (156 lb 6.4 oz)   12/01/22 72.5 kg (159 lb 12.8 oz)   09/01/22 72.2 kg (159 lb 1.6 oz)     GENERAL:  The patient is well developed, well-nourished, and appears to be in no apparent distress.   HEAD/NECK:    Normocephalic/atraumatic. clear sclera, pupils not pinpoint  CV:  Warm and well perfused.   LUNGS:   Normal work of breathing, no supplemental O2  EXTREMITIES:  No clubbing, cyanosis noted.  NEUROLOGIC:    The patient is alert and oriented, speech fluent, normal language.   MUSCULOSKELETAL:    Motor function  preserved.   GAIT:  The patient rises from a seated position with no difficulty and ambulates with nonantalgic gait without the assistance of a walking aid.   SKIN:   No obvious rashes, lesions, or erythema.  PSY:   Appropriate affect. No overt pain behaviors. No evidence of psychomotor retardation or agitation, no signs of intoxication.

## 2023-06-03 ENCOUNTER — Ambulatory Visit: Admit: 2023-06-03 | Payer: MEDICARE

## 2023-06-03 DIAGNOSIS — M961 Postlaminectomy syndrome, not elsewhere classified: Principal | ICD-10-CM

## 2023-06-03 MED ORDER — OXYCODONE 10 MG TABLET
ORAL_TABLET | Freq: Four times a day (QID) | ORAL | 0 refills | 7 days | Status: CP | PRN
Start: 2023-06-03 — End: ?

## 2023-06-03 MED ORDER — OXYCODONE ER 60 MG TABLET,CRUSH RESISTANT,EXTENDED RELEASE 12 HR
ORAL_TABLET | Freq: Three times a day (TID) | ORAL | 0 refills | 7 days | Status: CP
Start: 2023-06-03 — End: ?

## 2023-06-03 NOTE — Unmapped (Signed)
Refilled until her next appointment on 9/27.

## 2023-06-03 NOTE — Unmapped (Signed)
Patient called left message on nurse line stating that she was sorry to miss her appointment this afternoon. Asking if we can send her medication to [facility] in [place].

## 2023-06-09 NOTE — Unmapped (Signed)
The [facility] has made a second and final attempt to reach this patient to refill the following medication:Dupixent.      We have been unable to leave messages on the following phone numbers: [phone], have sent a text message to the following phone numbers: [phone], and have sent a Mychart questionnaire..    Dates contacted: 06/02/2023   06/09/2023  Last scheduled delivery: 05/11/2023    The patient may be at risk of non-compliance with this medication. The patient should call the [facility] at [phone]  Option 4, then Option 2: Dermatology, Gastroenterology, Rheumatology to refill medication.    Orlean Patten

## 2023-06-10 ENCOUNTER — Ambulatory Visit: Admit: 2023-06-10 | Payer: MEDICARE

## 2023-06-10 DIAGNOSIS — M961 Postlaminectomy syndrome, not elsewhere classified: Principal | ICD-10-CM

## 2023-06-10 MED ORDER — OXYCODONE ER 60 MG TABLET,CRUSH RESISTANT,EXTENDED RELEASE 12 HR
ORAL_TABLET | Freq: Three times a day (TID) | ORAL | 0 refills | 3 days | Status: CP
Start: 2023-06-10 — End: 2023-06-13

## 2023-06-10 MED ORDER — OXYCODONE 10 MG TABLET
ORAL_TABLET | Freq: Four times a day (QID) | ORAL | 0 refills | 3 days | Status: CP | PRN
Start: 2023-06-10 — End: ?

## 2023-06-10 NOTE — Unmapped (Signed)
PDMD reviewed, appropriate. Refilled until next appointment on 9/30.

## 2023-06-10 NOTE — Unmapped (Incomplete)
Chronic Pain Follow Up Note    Assessment and Plan:  Carla Evans is a 62 y.o. being followed at [facility] for complaint of chronic lower back pain 2/2 lumbar post-laminectomy syndrome and lumbar spinal canal stenosis at L3-L4. We have continued to encourage the patient to see spine surgery as she reports continued lower back and bilateral lower extremity discomfort consistent with neurogenic claudication. The patient also reports persistent right lower extremity weakness. She has previously had injections, including LESI and facet injections, with no benefit (at previous pain practice). Patient has a long standing hx of GYN related urinary incontinence. The patient has historically been hesitant about undergoing surgical evaluation though recently more amenable especially now in light of worsening pain and symptoms.     1. Lumbar post-laminectomy syndrome/Lumbar spinal stenosis  ***  Patient presents with persistent lumbar radicular symptoms and is concerned for progression of disease. She denies red flag symptoms, but does endorse gait changes and imbalance when walking. MRI lumbar results on 10/07/22 showed presence of severe spinal canal narrowing and moderate bilateral neuroforaminal narrowing at the L3-L4 level.   - Continue current medications: Oxycontin 60 mg every 8 hours and Oxycodone 10 mg every 6 hours.  - Refill prescription for Tizanidine (Zanaflex) for muscle relaxation.  - Encourage scheduling and attendance at an appointment with a spine surgeon to discuss options, prognosis, and potential need for surgery    2. Cervical spinal canal stenosis and cervical radiculopathy;   ***  Cervical MRI on 10/07/22 showing presence of severe spinal canal narrowing and moderate bilateral neuro foraminal narrowing at the level of C4-C5. Patient continues on OxyContin 60 mg TID and Oxycodone 10 mg q6h prn with benefit. We will continue her opioid medications without changes.   - Patient encouraged to discuss treatment plan with spine surgery during her future appointment  - Continue Oxycontin 60 mg TID, refilled  - Continue Oxycodone 10 mg q6h prn, refilled,  - Continue Amitiza prn   - UDS/treatment agreement up to date     2. Right Foot Pain  ***  - No MRI evidence of L5 or S1 nerve root compression - with evidence of pain on weight bearing pain likely non-spinal in nature  - Refer to a local podiatrist for evaluation and management.  - Monitor for any changes or worsening of symptoms.    3. Asthma and Smoking  ***  - Encourage cessation of smoking due to potential impact on surgical outcomes and overall health.  - Continue with the current asthma management plan.    4. Gastroesophageal Reflux Disease (GERD)  ***  - Continue Protonix as prescribed by PCP  - Avoid ibuprofen for back pain to prevent exacerbation of GERD symptoms.    5. Opioid induced constipation  ***  - Note improvement with the consumption of prunes.  - No longer on Amitiza    The patient has an active treatment agreement on file, the patient's last urine toxicology screen was appropriate and it was within the last 12 months, patient brought medications available for pill counts to today's visit and the pill counts were appropriate, the [place] database was reviewed and is appropriate, the patient denies any misuse abuse or diversion of medication and reports an medication significantly improve patient's quality of life as well as functionality status. The patient denies any medication associated side effects and wishes to continue on medications as currently prescribed. Risks and benefits of opiate medications were once again discussed with patient  and patient describes her understanding.    I have reviewed the [facility] Medical Board statement on use of controlled substances for the treatment of pain as well as the CDC Guideline for Prescribing Opioids for Chronic Pain. I have reviewed the [place] Controlled Substance Monitoring Database.    Medication Monitoring:   PDMP reviewed: 06/09/23  Last urine toxicology: 12/01/22 - appropriate, sample given 3/20  Last narcotic agreement: 12/01/22  Previous Compliance Issues: Did not bring Oxycodone for pill count 04/03/18, UDS inappropriately positive for hydrocodone on 02/21/20   Naloxone ordered: Yes  Total morphine equivalents: > 300  Benzodiazepine: No  ORT: 0 on 04/03/18    Instruct the patient to follow up as needed for any changes in symptoms or concerns. Encourage attendance at scheduled appointments with specialists for comprehensive care.    Requested Prescriptions      No prescriptions requested or ordered in this encounter     No orders of the defined types were placed in this encounter.    HPI:  Carla Evans is a 62 y.o. being followed at [facility] for complaint of chronic lower back pain 2/2 lumbar post-laminectomy syndrome.     The patient was last seen in June, at which point the patient reported  persistent lumbar radicular symptoms and was concerned for progression of disease. She denied red flag symptoms, but did endorse gait changes and imbalance when walking. Encouraged scheduling and attendance at an appointment with a spine surgeon to discuss options, prognosis, and potential need for surgery.  Patient continued on OxyContin 60 mg TID and Oxycodone 10 mg q6h prn with benefit. We continued her opioid medications without changes. For right foot pain, there was no MRI evidence of L5 or S1 nerve root compression - with evidence of pain on weight bearing pain likely non-spinal in nature. Encouraged cessation of smoking due to potential impact on surgical outcomes and overall health. Continued Protonix as prescribed by PCP. Note improvement of opioid induced constipation with the consumption of prunes.    Since last visit, no notable history.     Today, ***      Current medication regimen:  - Oxycontin 60 mg TID  - Oxycodone 10 mg q6h PRN (4x daily)  - Tizanidine 4 mg q8h prn   - Amitiza every day - occasionally    The patient states her pain is located *** and the severity of her pain ranges from ***/10 to ***/10.  Her pain currently is ***/10 and on average is ***/10.  She describes the sensation of her pain as {pain described:58928}. Her pain is present {pain frequency:58931} and worst {pain worst:59685}. The patient???s pain impacts {negatively affected:59709}. Her interval history includes {interval history:59710}. Her pain {pain changed:59711}, and she {does does not blank:47747} have new pain to discuss today. She {is/is not:36772} on blood thinners or anti-coagulants. In regards to medications currently taken for pain management, the patient {is:54246::is} tolerating these medications well and complains of associated side effects: {pain med side effects:59712}.        Patient denies homicidal/suicidal ideation.    Allergies  Allergies   Allergen Reactions    Celebrex [Celecoxib] Swelling    Latex Shortness Of Breath and Rash    Latex, Natural Rubber Shortness Of Breath and Rash    Vioxx [Rofecoxib] Swelling     Severe facial swelling. my face swelled up like a basketball    Xtampza Er [Oxycodone Myristate] Swelling     throat started to close  burned neck    Augmentin [Amoxicillin-Pot Clavulanate] Rash    Other      Due to eczema on hands, unable to touch raw fruits and vegetables and most perfume soaps and lotions. Causes blisters, breaking of skin, and bleeding.   Pt reports she can eat raw friut and vegetables, just can't touch them with her hands.    Adhesive Rash       Home Medications    Current Outpatient Medications   Medication Sig Dispense Refill    albuterol (PROVENTIL HFA;VENTOLIN HFA) 90 mcg/actuation inhaler Inhale 2 puffs daily as needed.      apremilast (OTEZLA STARTER) 10 mg (4)-20 mg (4)-30 mg (47) DsPk Take 10mg  once on day 1 (d1), 10mg  2x/day (d2), 10mg  in AM and 20mg  in PM (d3), 20mg  2x/day (d4), 20mg  in AM and 30mg  in PM (d5), and 30mg  2x/day after that 55 tablet 0    apremilast (OTEZLA) 30 mg Tab Take 1 tablet by mouth twice a day to treat rash on hands 60 tablet 11    cetirizine (ZYRTEC) 10 MG tablet Take 1 tablet (10 mg total) by mouth daily.      clobetasol (TEMOVATE) 0.05 % ointment Apply topically two (2) times a day. 30 g 2    COURIERED MED OR SUPPLY [id number], Dupixent, 300mg /8ml, subcutaneous injection.   [id number], Sharps Container, 1 box. 1 each 0    cyanocobalamin, vitamin B-12, 1,000 mcg/mL injection Frequency:Q2W   Dosage:1   ML  Instructions:  Note:Dose:      dupilumab 300 mg/2 mL PnIj Inject the contents of 1 pen (300 mg) under the skin every fourteen (14) days to treat rash. 4 mL 11    dupilumab 300 mg/2 mL PnIj Inject the contents of 2 pens (600mg ) under the skin for loading dose 6 mL 0    empty container Misc Use as directed 1 each 3    fluticasone propionate (FLONASE) 50 mcg/actuation nasal spray 1 spray into each nostril daily.      fluticasone propionate (FLOVENT HFA) 220 mcg/actuation inhaler Inhale 2 puffs daily as needed. Frequency:BID   Dosage:220   MCG  Instructions:  Note:Dose:      hydrOXYzine (ATARAX) 25 MG tablet Take 1 tablet (25 mg total) by mouth two (2) times a day as needed. 180 tablet 0    ibuprofen (ADVIL,MOTRIN) 200 MG tablet Take 1 tablet (200 mg total) by mouth every six (6) hours as needed for pain. 2 tabs as needed      lubiprostone (AMITIZA) 24 MCG capsule Take 1 capsule (24 mcg total) by mouth daily as needed. 90 capsule 0    naloxone (NARCAN) 4 mg nasal spray One spray in either nostril once for known/suspected opioid overdose. May repeat every 2-3 minutes in alternating nostril til EMS arrives 2 each 0    oxyCODONE (OXYCONTIN) 60 mg 12 hr crush resistant ER/CR tablet Take 1 tablet (60 mg total) by mouth every eight (8) hours. 90 tablet 0    oxyCODONE (OXYCONTIN) 60 mg 12 hr crush resistant ER/CR tablet Take 1 tablet (60 mg total) by mouth every eight (8) hours. 90 tablet 0    oxyCODONE (OXYCONTIN) 60 mg 12 hr crush resistant ER/CR tablet Take 1 tablet (60 mg total) by mouth every eight (8) hours. 90 tablet 0    oxyCODONE (OXYCONTIN) 60 mg 12 hr crush resistant ER/CR tablet Take 1 tablet (60 mg total) by mouth every eight (8) hours. DNF till 04/04/23 90 tablet 0  oxyCODONE (OXYCONTIN) 60 mg 12 hr crush resistant ER/CR tablet Take 1 tablet (60 mg total) by mouth every eight (8) hours. DNF till 03/03/23 (refill 2 days early due to travel) 90 tablet 0    oxyCODONE (OXYCONTIN) 60 mg 12 hr crush resistant ER/CR tablet Take 1 tablet (60 mg total) by mouth every eight (8) hours. 21 tablet 0    oxyCODONE (ROXICODONE) 10 mg immediate release tablet Take 1 tablet (10 mg total) by mouth every six (6) hours as needed for pain 28 tablet 0    pantoprazole (PROTONIX) 40 MG tablet Take 1 tablet (40 mg total) by mouth daily.      promethazine (PHENERGAN) 25 MG tablet Take 1 tablet (25 mg total) by mouth once as needed.      salmeterol (SEREVENT) 50 mcg/dose diskus inhaler Inhale 1 puff.      tiotropium (SPIRIVA) 18 mcg inhalation capsule Place 1 capsule (18 mcg total) into inhaler and inhale.      tizanidine (ZANAFLEX) 4 MG tablet Take 1 tablet (4 mg total) by mouth every eight (8) hours as needed. 90 tablet 2     No current facility-administered medications for this visit.     Imaging:  MRI Cervical Spine 10/07/22  Impression   Moderate to marked multilevel degenerative changes throughout the cervical spine, most prominently;   1. Severe spinal canal narrowing and moderate bilateral neuroforaminal narrowing at the C4-5 level. Mildly increased T2 signal abnormality of the spinal cord at this level, possibly reflecting myomalacia.   2. Moderate spinal canal narrowing at C3-4 and C5-6.       MRI Lumbar Spine 10/07/22  Postsurgical changes from laminectomy at L4-L5 and L5-S1, and posterior spinal fusion hardware from L4-L5, with associated metal artifact.      Multilevel degenerative changes, most prominently severe spinal canal narrowing and moderate bilateral neuroforaminal narrowing at the L3-L4 level. Additional details above.        Narrative   EXAM: Magnetic resonance imaging, spinal canal and contents, lumbar, without contrast material.   DATE: 10/07/2022 4:03 PM   ACCESSION: [id number]   DICTATED: 10/07/2022 4:20 PM   INTERPRETATION LOCATION: [facility]      CLINICAL INDICATION: 62 years old Female with Back pain/LLE symptoms worsening; concern for worsening stenosis  - M96.1 - Lumbar post - laminectomy syndrome - M54.16 - Lumbar radiculopathy        COMPARISON: MRI lumbar spine 01/29/2015      TECHNIQUE: Multiplanar MRI was performed through the lumbar spine without intravenous contrast.      FINDINGS:   Posterior spinal fusion hardware at the L4 and L5 level with associated metal artifact. Postlaminectomy changes at L4-L5 and L5-S1.      Degenerative bone marrow signal at the L3-L4 level. The visualized cord is unremarkable and the conus medullaris ends at a normal level.      Grade 1 retrolisthesis L2 on L3 and grade 1 anterolisthesis L3 on L4. Multilevel disc desiccation.          T12-L1: Facet arthropathy. No significant spinal canal or neural foraminal narrowing.      L1-L2: Ligamentum flavum hypertrophy and facet arthropathy. No significant spinal canal or neural foraminal narrowing.      L2-L3: Posterior disc bulge, ligamentum flavum hypertrophy, and facet arthropathy. Moderate spinal canal narrowing. Mild bilateral neuroforaminal narrowing.      L3-L4: Posterior disc bulge, ligamentum flavum hypertrophy, and facet arthropathy. Severe spinal canal narrowing, with crowding of the cauda equina nerve roots.  Moderate bilateral neuroforaminal narrowing.      L4-L5: Postsurgical changes. No significant spinal canal narrowing. Mild bilateral neuroforaminal narrowing.      L5-S1: Posterior disc bulge and postsurgical changes. No significant spinal canal narrowing. Mild right neuroforaminal narrowing. Moderate left neuroforaminal narrowing. The paraspinal tissues are within normal limits.      For the purposes of this dictation, the lowest well formed intervertebral disc space is assumed to be the L5-S1 level, and there are presumed to be five lumbar-type vertebral bodies.           Review Of Systems:  General {ajlrosgen:46920}  Cardiovascular {aggROSCV:37303}  Gastrointestinal {aggROSGI:37304}  Skin {aggROSskin:37305}  Endocrine {aggROSendo:37306}  Musculoskeletal {aggROSmusk:37307}  Neurologic {aggROSneu:37308}  Psychiatric {aggROSpsych:37309}    Physical Exam:  VITALS:   There were no vitals filed for this visit.        Wt Readings from Last 3 Encounters:   03/03/23 70.9 kg (156 lb 6.4 oz)   12/01/22 72.5 kg (159 lb 12.8 oz)   09/01/22 72.2 kg (159 lb 1.6 oz)     GENERAL:  The patient is well developed, well-nourished, and appears to be in no apparent distress.   HEAD/NECK:    Normocephalic/atraumatic. clear sclera, pupils not pinpoint  CV:  Warm and well perfused.   LUNGS:   Normal work of breathing, no supplemental O2  EXTREMITIES:  No clubbing, cyanosis noted.  NEUROLOGIC:    The patient is alert and oriented, speech fluent, normal language.   MUSCULOSKELETAL:    Motor function  preserved.   GAIT:  The patient rises from a seated position with no difficulty and ambulates with nonantalgic gait without the assistance of a walking aid.   SKIN:   No obvious rashes, lesions, or erythema.  PSY:   Appropriate affect. No overt pain behaviors. No evidence of psychomotor retardation or agitation, no signs of intoxication.

## 2023-06-10 NOTE — Unmapped (Signed)
Patient had to reschedule appointment due to weather, she lives in [place] and states it is really bad where she is. Asking for refills of her OxyContin and Oxycodone to be sent to [facility] as she will be out but has appointment for Monday 06/13/23. Requesting her script says Brand name only as pharmacy had trouble filling last script.

## 2023-06-13 ENCOUNTER — Ambulatory Visit: Admit: 2023-06-13 | Discharge: 2023-06-14 | Payer: MEDICARE

## 2023-06-13 DIAGNOSIS — M961 Postlaminectomy syndrome, not elsewhere classified: Principal | ICD-10-CM

## 2023-06-13 DIAGNOSIS — G894 Chronic pain syndrome: Principal | ICD-10-CM

## 2023-06-13 MED ORDER — OXYCODONE ER 60 MG TABLET,CRUSH RESISTANT,EXTENDED RELEASE 12 HR
ORAL_TABLET | Freq: Three times a day (TID) | ORAL | 0 refills | 30 days | Status: CN
Start: 2023-06-13 — End: ?

## 2023-06-13 MED ORDER — OXYCODONE 10 MG TABLET
ORAL_TABLET | Freq: Four times a day (QID) | ORAL | 0 refills | 30 days | Status: CP | PRN
Start: 2023-06-13 — End: ?

## 2023-06-13 NOTE — Unmapped (Signed)
Medication:oxyCODONE (OXYCONTIN) 60 mg 12 hr crush resistant ER/CR tablet   ZOX:WRUE 1 tablet (60 mg total) by mouth every eight (8) hours.   Quantity on RX: #9  Filled on:06/10/2023  Pill count today: #2    Medication:oxyCODONE (ROXICODONE) 10 mg immediate release tablet   AVW:UJWJ 1 tablet (10 mg total) by mouth every six (6) hours as needed for pain.   Quantity on RX: #12  Filled on:06/10/2023  Pill count today: #3

## 2023-06-13 NOTE — Unmapped (Signed)
Chronic Pain Follow Up Note    Assessment and Plan:  Carla Evans is a 62 y.o. being followed at [facility] for complaint of chronic lower back pain 2/2 lumbar post-laminectomy syndrome and lumbar spinal canal stenosis at L3-L4. We have continued to encourage the patient to see spine surgery as she reports continued lower back and bilateral lower extremity discomfort consistent with neurogenic claudication. The patient also reports persistent right lower extremity weakness. She has previously had injections, including LESI and facet injections, with no benefit (at previous pain practice). Patient has a long standing hx of GYN related urinary incontinence. The patient has historically been hesitant about undergoing surgical evaluation though recently more amenable especially now in light of worsening pain and symptoms.     1. Lumbar post-laminectomy syndrome/Lumbar spinal stenosis  Patient presents with unchanged lumbar radicular symptoms. She denies red flag symptoms, but does endorse gait changes. She denies new or worsening of symptoms. MRI lumbar results on 10/07/22 showed presence of severe spinal canal narrowing and moderate bilateral neuroforaminal narrowing at the L3-L4 level.   - Continue current medications: Oxycontin 60 mg every 8 hours and Oxycodone 10 mg every 6 hours  - Continue Tizanidine (Zanaflex) for muscle relaxation, refill remains  - Encourage scheduling and attendance at an appointment with a spine surgeon to discuss options, and prognosis. Pt states she has been postponing r/t car issues.    2. Cervical spinal canal stenosis and cervical radiculopathy;   No complain on today's visit. Cervical MRI on 10/07/22 showing presence of severe spinal canal narrowing and moderate bilateral neuro foraminal narrowing at the level of C4-C5. Patient continues on OxyContin 60 mg TID and Oxycodone 10 mg q6h prn with benefit. We will continue her opioid medications without changes.   - Patient encouraged to discuss treatment plan with spine surgery during her future appointment  - Continue Oxycontin 60 mg TID, refilled  - Continue Oxycodone 10 mg q6h prn, refilled,  - Continue Amitiza prn   - UDS/treatment agreement up to date     2. Right Foot Pain  - No complain on today's visit. No MRI evidence of L5 or S1 nerve root compression - with evidence of pain on weight bearing pain likely non-spinal in nature  - Follow up local podiatrist for evaluation and management, referred on previous visit  - Monitor for any changes or worsening of symptoms.    3. Opioid induced constipation  Improved.   - Note improvement with the consumption of prunes.  - No longer on Amitiza    The patient has an active treatment agreement on file, the patient's last urine toxicology screen was appropriate and it was within the last 12 months, patient brought medications available for pill counts to today's visit and the pill counts were appropriate, the [place] database was reviewed and is appropriate, the patient denies any misuse abuse or diversion of medication and reports an medication significantly improve patient's quality of life as well as functionality status. The patient denies any medication associated side effects and wishes to continue on medications as currently prescribed. Risks and benefits of opiate medications were once again discussed with patient and patient describes her understanding.    I have reviewed the [facility] Medical Board statement on use of controlled substances for the treatment of pain as well as the CDC Guideline for Prescribing Opioids for Chronic Pain. I have reviewed the [place] Controlled Substance Monitoring Database.    Medication Monitoring:   PDMP reviewed:  06/13/23  Last urine toxicology: 12/01/22 - appropriate, sample given 3/20  Last narcotic agreement: 12/01/22  Previous Compliance Issues: Did not bring Oxycodone for pill count 04/03/18, UDS inappropriately positive for hydrocodone on 02/21/20 Naloxone ordered: Yes  Total morphine equivalents: > 300  Benzodiazepine: No  ORT: 0 on 04/03/18    Instruct the patient to follow up as needed for any changes in symptoms or concerns. Encourage attendance at scheduled appointments with specialists for comprehensive care.    Requested Prescriptions     Signed Prescriptions Disp Refills    oxyCODONE (OXYCONTIN) 60 mg 12 hr crush resistant ER/CR tablet 90 tablet 0     Sig: Take 1 tablet (60 mg total) by mouth every eight (8) hours. DNF 08/12/23    oxyCODONE (OXYCONTIN) 60 mg 12 hr crush resistant ER/CR tablet 90 tablet 0     Sig: Take 1 tablet (60 mg total) by mouth every eight (8) hours. DNF 07/13/23    oxyCODONE (OXYCONTIN) 60 mg 12 hr crush resistant ER/CR tablet 90 tablet 0     Sig: Take 1 tablet (60 mg total) by mouth every eight (8) hours. DNF 06/13/23    oxyCODONE (ROXICODONE) 10 mg immediate release tablet 120 tablet 0     Sig: Take 1 tablet (10 mg total) by mouth every six (6) hours as needed for pain.    oxyCODONE (ROXICODONE) 10 mg immediate release tablet 120 tablet 0     Sig: Take 1 tablet (10 mg total) by mouth every six (6) hours as needed for pain. DNF 07/13/23    oxyCODONE (ROXICODONE) 10 mg immediate release tablet 120 tablet 0     Sig: Take 1 tablet (10 mg total) by mouth every six (6) hours as needed for pain. DNF 08/12/23     No orders of the defined types were placed in this encounter.    HPI:  Carla Evans is a 62 y.o. being followed at [facility] clinic for complaint of chronic lower back pain 2/2 lumbar post-laminectomy syndrome.     The patient was last seen in June, at which point the patient reported  persistent lumbar radicular symptoms and was concerned for progression of disease. She denied red flag symptoms, but did endorse gait changes and imbalance when walking. Encouraged scheduling and attendance at an appointment with a spine surgeon to discuss options, prognosis, and potential need for surgery.  Patient continued on OxyContin 60 mg TID and Oxycodone 10 mg q6h prn with benefit. We continued her opioid medications without changes. For right foot pain, there was no MRI evidence of L5 or S1 nerve root compression - with evidence of pain on weight bearing pain likely non-spinal in nature. Encouraged cessation of smoking due to potential impact on surgical outcomes and overall health. Continued Protonix as prescribed by PCP. Note improvement of opioid induced constipation with the consumption of prunes.    Since last visit, no notable history.     Today, the patient presents reporting overall unchanged pain since last visit. She denies any new or worsening symptoms. She states she plans to see spine surgeon to discuss options but she has been postponing r/t car issues. In terms of medications, she endorses benefit from current medication regimen without any adverse side effects.    Current medication regimen:  - Oxycontin 60 mg TID  - Oxycodone 10 mg q6h PRN (4x daily)  - Tizanidine 4 mg q8h prn   - Amitiza every day - occasionally  Current view: Showing all answers           [facility] Return Patient Questionnaire       Question 06/13/2023  8:40 AM EDT - Ceasar Mons by Patient    What is the reason for your visit? Medication Refill    Date of onset of your pain: 08/01/1998    Please rate your pain at its WORST in the past month. 10    Please rate your pain at its LEAST in the past month. 8    Please rate your pain as it is RIGHT NOW. 9    Please rate your pain on AVERAGE in the past month. 8    Please circle the location of your pain.       Please select the words that describe your pain. Burning     Pressing     Sharp     shooting     Stabbing Tender Throbbing    How often do you have pain? All the time    When is your pain the worst? Mornings    Which of the following have been negatively affected by your pain? Enjoyment of life     General activity     Mood     Normal work     Recreational activities Sleep     Walking     Sitting     Standing    Since your last visit:     Have you had any of the following?       Do you have any new pain you would like to discuss with your doctor?       How has your pain changed? Worse    Are you currently taking any blood-thinners or anticoagulants? No    If you are on Pain Medication - Are you having any of the following? Agitation     I am stable on my current medication regime     My medications help to improve my quality of live    If you have had a procedure since your last visit, how much pain relief was obtained?       If you have had a procedure since your last visit, were there any complications?       General: Fatigue     Difficulty Sleeping    Cardiovascular: Shortness of Breath with Exertion     Swelling of Ankles/Legs    Gastrointestinal - (Intestinal): Heartburn     Gastric Reflux    Skin: Rash     Skin changes     Itching     Dryness    Endocrine (Hormonal System): Excess Thirst     Excess Urination     Change in hair or skin texture     Thinning hair     Intolerance to heat or cold     Decreased sex drive     Decreased sexual performance     Difficulties with Orgasm (Delayed or None)    Musculoskeletal System - (Muscles, Joints and Coverings): Joint Aches/Swelling     Spasms/Spasticity/Cramps     Back pain     Muscle Aches/Weakness     Muscle Wasting    Neurologic: Coordination Difficulty     Headaches/Migraines     Numbness/Tingling    Psychiatric: Anxiety     Low Energy     Panic Attacks     Lack of interest in activities     Feeling Hopeless/Helpless  [facility] Patient-Entered Hpi Selection Questionnaire       Question 06/13/2023  8:47 AM EDT - Ceasar Mons by Patient    What is the primary reason for your visit? Back Pain    Your back pain Has lasted for 3 months or more    When did you first notice your back pain? More than 1 year ago    How often do you feel back pain? Constantly    Since you first noticed your back pain, how has it changed? Rapidly worsening    Where is your back pain located? Buttocks     Lower back - above the waist     Lower back - below the waist     Upper back    How would you describe your back pain? Burning     Shooting     Stabbing    Where does your back pain spread? Left foot     Right foot    On a scale of 0 to 10 (10 being the worst), how strong is your back pain? 10    Your back pain is... the same all the time    What makes your back pain worse? Bending     Coughing     Certain positions     Lying down     Sitting     Standing     Stress     Twisting    When do you feel stiffness in your back? All day    Are you experiencing any of the following symptoms with your back pain?     Abdominal pain No    Bladder incontinence Yes    Bowel incontinence No    Chest pain       Fever       Headaches Yes    Leg pain Yes    Numbness Yes    Numbness around the anus       Painful urination       Paralysis       Pelvic pain       Pins and needles Yes    Tingling Yes    Weakness Yes    Weight loss Yes    Do any of the following apply to you? History of cancer     Lack of exercise     Menopause     Poor posture     Inactive lifestyle           Patient denies homicidal/suicidal ideation.    Allergies  Allergies   Allergen Reactions    Celebrex [Celecoxib] Swelling    Latex Shortness Of Breath and Rash    Latex, Natural Rubber Shortness Of Breath and Rash    Vioxx [Rofecoxib] Swelling     Severe facial swelling. my face swelled up like a basketball    Xtampza Er [Oxycodone Myristate] Swelling     throat started to close burned neck    Augmentin [Amoxicillin-Pot Clavulanate] Rash    Other      Due to eczema on hands, unable to touch raw fruits and vegetables and most perfume soaps and lotions. Causes blisters, breaking of skin, and bleeding.   Pt reports she can eat raw friut and vegetables, just can't touch them with her hands.    Adhesive Rash       Home Medications    Current Outpatient Medications   Medication Sig Dispense Refill albuterol (PROVENTIL HFA;VENTOLIN HFA) 90 mcg/actuation inhaler Inhale 2 puffs daily as  needed.      apremilast (OTEZLA STARTER) 10 mg (4)-20 mg (4)-30 mg (47) DsPk Take 10mg  once on day 1 (d1), 10mg  2x/day (d2), 10mg  in AM and 20mg  in PM (d3), 20mg  2x/day (d4), 20mg  in AM and 30mg  in PM (d5), and 30mg  2x/day after that 55 tablet 0    apremilast (OTEZLA) 30 mg Tab Take 1 tablet by mouth twice a day to treat rash on hands 60 tablet 11    cetirizine (ZYRTEC) 10 MG tablet Take 1 tablet (10 mg total) by mouth daily.      clobetasol (TEMOVATE) 0.05 % ointment Apply topically two (2) times a day. 30 g 2    COURIERED MED OR SUPPLY [id number], Dupixent, 300mg /42ml, subcutaneous injection.   [id number], Sharps Container, 1 box. 1 each 0    cyanocobalamin, vitamin B-12, 1,000 mcg/mL injection Frequency:Q2W   Dosage:1   ML  Instructions:  Note:Dose:      dupilumab 300 mg/2 mL PnIj Inject the contents of 1 pen (300 mg) under the skin every fourteen (14) days to treat rash. 4 mL 11    dupilumab 300 mg/2 mL PnIj Inject the contents of 2 pens (600mg ) under the skin for loading dose 6 mL 0    empty container Misc Use as directed 1 each 3    fluticasone propionate (FLONASE) 50 mcg/actuation nasal spray 1 spray into each nostril daily.      fluticasone propionate (FLOVENT HFA) 220 mcg/actuation inhaler Inhale 2 puffs daily as needed. Frequency:BID   Dosage:220   MCG  Instructions:  Note:Dose:      hydrOXYzine (ATARAX) 25 MG tablet Take 1 tablet (25 mg total) by mouth two (2) times a day as needed. 180 tablet 0    ibuprofen (ADVIL,MOTRIN) 200 MG tablet Take 1 tablet (200 mg total) by mouth every six (6) hours as needed for pain. 2 tabs as needed      lubiprostone (AMITIZA) 24 MCG capsule Take 1 capsule (24 mcg total) by mouth daily as needed. 90 capsule 0    naloxone (NARCAN) 4 mg nasal spray One spray in either nostril once for known/suspected opioid overdose. May repeat every 2-3 minutes in alternating nostril til EMS arrives 2 each 0    oxyCODONE (OXYCONTIN) 60 mg 12 hr crush resistant ER/CR tablet Take 1 tablet (60 mg total) by mouth every eight (8) hours. 90 tablet 0    oxyCODONE (OXYCONTIN) 60 mg 12 hr crush resistant ER/CR tablet Take 1 tablet (60 mg total) by mouth every eight (8) hours. 90 tablet 0    oxyCODONE (OXYCONTIN) 60 mg 12 hr crush resistant ER/CR tablet Take 1 tablet (60 mg total) by mouth every eight (8) hours. 90 tablet 0    oxyCODONE (OXYCONTIN) 60 mg 12 hr crush resistant ER/CR tablet Take 1 tablet (60 mg total) by mouth every eight (8) hours. DNF till 04/04/23 90 tablet 0    oxyCODONE (OXYCONTIN) 60 mg 12 hr crush resistant ER/CR tablet Take 1 tablet (60 mg total) by mouth every eight (8) hours. DNF till 03/03/23 (refill 2 days early due to travel) 90 tablet 0    oxyCODONE (OXYCONTIN) 60 mg 12 hr crush resistant ER/CR tablet Take 1 tablet (60 mg total) by mouth every eight (8) hours for 3 days. 9 tablet 0    oxyCODONE (ROXICODONE) 10 mg immediate release tablet Take 1 tablet (10 mg total) by mouth every six (6) hours as needed for pain. 12 tablet 0  pantoprazole (PROTONIX) 40 MG tablet Take 1 tablet (40 mg total) by mouth daily.      promethazine (PHENERGAN) 25 MG tablet Take 1 tablet (25 mg total) by mouth once as needed.      salmeterol (SEREVENT) 50 mcg/dose diskus inhaler Inhale 1 puff.      tiotropium (SPIRIVA) 18 mcg inhalation capsule Place 1 capsule (18 mcg total) into inhaler and inhale.      tizanidine (ZANAFLEX) 4 MG tablet Take 1 tablet (4 mg total) by mouth every eight (8) hours as needed. 90 tablet 2     No current facility-administered medications for this visit.     Imaging:  MRI Cervical Spine 10/07/22  Impression   Moderate to marked multilevel degenerative changes throughout the cervical spine, most prominently;   1. Severe spinal canal narrowing and moderate bilateral neuroforaminal narrowing at the C4-5 level. Mildly increased T2 signal abnormality of the spinal cord at this level, possibly reflecting myomalacia.   2. Moderate spinal canal narrowing at C3-4 and C5-6.       MRI Lumbar Spine 10/07/22  Postsurgical changes from laminectomy at L4-L5 and L5-S1, and posterior spinal fusion hardware from L4-L5, with associated metal artifact.      Multilevel degenerative changes, most prominently severe spinal canal narrowing and moderate bilateral neuroforaminal narrowing at the L3-L4 level. Additional details above.        Narrative   EXAM: Magnetic resonance imaging, spinal canal and contents, lumbar, without contrast material.   DATE: 10/07/2022 4:03 PM   ACCESSION: [id number]   DICTATED: 10/07/2022 4:20 PM   INTERPRETATION LOCATION: [facility]      CLINICAL INDICATION: 62 years old Female with Back pain/LLE symptoms worsening; concern for worsening stenosis  - M96.1 - Lumbar post - laminectomy syndrome - M54.16 - Lumbar radiculopathy        COMPARISON: MRI lumbar spine 01/29/2015      TECHNIQUE: Multiplanar MRI was performed through the lumbar spine without intravenous contrast.      FINDINGS:   Posterior spinal fusion hardware at the L4 and L5 level with associated metal artifact. Postlaminectomy changes at L4-L5 and L5-S1.      Degenerative bone marrow signal at the L3-L4 level. The visualized cord is unremarkable and the conus medullaris ends at a normal level.      Grade 1 retrolisthesis L2 on L3 and grade 1 anterolisthesis L3 on L4. Multilevel disc desiccation.          T12-L1: Facet arthropathy. No significant spinal canal or neural foraminal narrowing.      L1-L2: Ligamentum flavum hypertrophy and facet arthropathy. No significant spinal canal or neural foraminal narrowing.      L2-L3: Posterior disc bulge, ligamentum flavum hypertrophy, and facet arthropathy. Moderate spinal canal narrowing. Mild bilateral neuroforaminal narrowing.      L3-L4: Posterior disc bulge, ligamentum flavum hypertrophy, and facet arthropathy. Severe spinal canal narrowing, with crowding of the cauda equina nerve roots. Moderate bilateral neuroforaminal narrowing.      L4-L5: Postsurgical changes. No significant spinal canal narrowing. Mild bilateral neuroforaminal narrowing.      L5-S1: Posterior disc bulge and postsurgical changes. No significant spinal canal narrowing. Mild right neuroforaminal narrowing. Moderate left neuroforaminal narrowing.      The paraspinal tissues are within normal limits.      For the purposes of this dictation, the lowest well formed intervertebral disc space is assumed to be the L5-S1 level, and there are presumed to be five lumbar-type vertebral bodies.  Review Of Systems:  See questionnaire   Physical Exam:  VITALS:   Vitals:    06/13/23 1452   BP: 146/77   Pulse: 84   Resp: 16   Temp: 36.2 ??C (97.1 ??F)   SpO2: 91%     Wt Readings from Last 3 Encounters:   06/13/23 72.4 kg (159 lb 11.2 oz)   03/03/23 70.9 kg (156 lb 6.4 oz)   12/01/22 72.5 kg (159 lb 12.8 oz)     GENERAL:  The patient is well developed, well-nourished, and appears to be in no apparent distress.   HEAD/NECK:    Normocephalic/atraumatic. clear sclera, pupils not pinpoint  CV:  Warm and well perfused.   LUNGS:   Normal work of breathing, no supplemental O2  EXTREMITIES:  No clubbing, cyanosis noted.  NEUROLOGIC:    The patient is alert and oriented, speech fluent, normal language.   MUSCULOSKELETAL:    Motor function  preserved.   GAIT:  The patient rises from a seated position with no difficulty and ambulates with nonantalgic gait without the assistance of a walking aid.   SKIN:   No obvious rashes, lesions, or erythema.  PSY:   Appropriate affect. No overt pain behaviors. No evidence of psychomotor retardation or agitation, no signs of intoxication.   We are delivering comprehensive, continuous, longitudinal care for this patient with chronic pain.

## 2023-06-13 NOTE — Unmapped (Signed)
It was good to see you today.    I have refilled your medications with no changes.    We will see you in 3 months, or sooner if needed.

## 2023-06-14 DIAGNOSIS — M961 Postlaminectomy syndrome, not elsewhere classified: Principal | ICD-10-CM

## 2023-06-14 MED ORDER — OXYCODONE ER 60 MG TABLET,CRUSH RESISTANT,EXTENDED RELEASE 12 HR
ORAL_TABLET | Freq: Three times a day (TID) | ORAL | 0 refills | 30.00000 days | Status: CP
Start: 2023-06-14 — End: 2023-06-14

## 2023-06-14 MED ORDER — OXYCODONE 10 MG TABLET
ORAL_TABLET | Freq: Four times a day (QID) | ORAL | 0 refills | 30 days | Status: CN | PRN
Start: 2023-06-14 — End: ?

## 2023-06-14 NOTE — Unmapped (Unsigned)
I spoke with pt on phone and she states pharmacy only received 1 rx for Oxycontin 60 mg, I spoke with Nicole Cella at pharmacy they state they have 1 rx on file for oxycontin 60 mg with directions that say take 1 tablet every night #90.    I see the prescriptions that were sent have directions that say take 1 q 8 hrs, also there is an old note on the prescriptions from March that may need to be taken off, I have had pharmacy delete the 1 rx they have on file if you could please send new prescriptions for oxycontin 60 mg to [id number] [place], [place] - [address] .

## 2023-07-11 NOTE — Unmapped (Signed)
 [facility] Clinical Intervention    Type of intervention: Side effect management    Medication involved: Dupixent    Problem identified: Carla Evans called to report she stopped Dupixent due to concerns it was causing leg pain. She has been off therapy ~ 30 days and is finally feeling some relief from leg pain x 3 days now.    She has had a severe return of flaring on her hands and isn't sure how to proceed.    Intervention performed: I advised leg pain is not a common side effect of Dupixent, and it could be reasonable to re-challenge. We reviewed the medication has a long half-life and could remain in her system ~ 10 weeks after stopping.    We reviewed potential alternative therapies as well. Given her Hep B core positivity, it may be reasonable to avoid immunosuppressive therapy like Jak-inhibitors for now. Will message provider for input and suggested to patient she may want to reach out via mychart as well/ask for followup appointment.     Follow-up needed: na    Approximate time spent: >20 minutes    Clinical evidence used to support intervention: Professional judgement    Result of the intervention: Improved therapy effectiveness    Treyden Hakim [facility] Specialty Pharmacist

## 2023-07-13 MED ORDER — OXYCODONE ER 60 MG TABLET,CRUSH RESISTANT,EXTENDED RELEASE 12 HR
ORAL_TABLET | Freq: Three times a day (TID) | ORAL | 0 refills | 30.00000 days | Status: CP
Start: 2023-07-13 — End: 2023-06-14

## 2023-07-13 MED ORDER — OXYCODONE 10 MG TABLET
ORAL_TABLET | Freq: Four times a day (QID) | ORAL | 0 refills | 30 days | Status: CP | PRN
Start: 2023-07-13 — End: ?

## 2023-08-01 NOTE — Unmapped (Signed)
Specialty Medication(s): Dupixent    Carla Evans has been dis-enrolled from the [facility] specialty pharmacy services due to medication discontinuation resulting from side effect intolerance.    Additional information provided to the patient: I sent mychart (VM was full) to recommend patient schedule followup with clinic to discuss other therapy options.     Carla Evans [facility] Specialty Pharmacist

## 2023-08-04 NOTE — Unmapped (Signed)
Called, left voicemail     Thanks,  [employer]

## 2023-08-12 MED ORDER — OXYCODONE 10 MG TABLET
ORAL_TABLET | Freq: Four times a day (QID) | ORAL | 0 refills | 30 days | Status: CP | PRN
Start: 2023-08-12 — End: ?

## 2023-08-12 MED ORDER — OXYCODONE ER 60 MG TABLET,CRUSH RESISTANT,EXTENDED RELEASE 12 HR
ORAL_TABLET | Freq: Three times a day (TID) | ORAL | 0 refills | 30 days | Status: CP
Start: 2023-08-12 — End: 2023-06-14

## 2023-08-13 MED ORDER — OXYCODONE ER 60 MG TABLET,CRUSH RESISTANT,EXTENDED RELEASE 12 HR
ORAL_TABLET | Freq: Three times a day (TID) | ORAL | 0 refills | 30 days | Status: CN
Start: 2023-08-13 — End: ?

## 2023-08-30 ENCOUNTER — Ambulatory Visit: Admit: 2023-08-30 | Discharge: 2023-08-31 | Payer: MEDICARE

## 2023-08-30 DIAGNOSIS — M961 Postlaminectomy syndrome, not elsewhere classified: Principal | ICD-10-CM

## 2023-08-30 NOTE — Unmapped (Signed)
 It was good to see you today.    I have refilled your medications with no changes.    We will see you in 3 months, or sooner if needed.

## 2023-08-30 NOTE — Unmapped (Signed)
Chronic Pain Follow Up Note    Assessment and Plan:  Carla Evans is a 62 y.o. being followed at [facility] for complaint of chronic lower back pain 2/2 lumbar post-laminectomy syndrome and lumbar spinal canal stenosis at L3-L4. We have continued to encourage the patient to see spine surgery as she reports continued lower back and bilateral lower extremity discomfort consistent with neurogenic claudication. The patient also reports persistent right lower extremity weakness. She has previously had injections, including LESI and facet injections, with no benefit (at previous pain practice). Patient has a long standing hx of GYN related urinary incontinence. The patient has historically been hesitant about undergoing surgical evaluation though recently more amenable especially now in light of worsening pain and symptoms.     1. Lumbar post-laminectomy syndrome/Lumbar spinal stenosis   Patient presents with stable lumbar radicular symptoms. She denies red flag symptoms. She denies new or worsening of symptoms. MRI lumbar results on 10/07/22 showed presence of severe spinal canal narrowing and moderate bilateral neuroforaminal narrowing at the L3-L4 level.   - Continue current medications: Oxycontin 60 mg every 8 hours and Oxycodone 10 mg every 6 hours  - Continue Tizanidine (Zanaflex) for muscle relaxation, refill remains    2. Cervical spinal canal stenosis and cervical radiculopathy;   No complain on today's visit. Patient continues on OxyContin 60 mg TID and Oxycodone 10 mg q6h prn with benefit. We will continue her opioid medications without changes.   - Continue Oxycontin 60 mg TID, refilled  - Continue Oxycodone 10 mg q6h prn, refilled,  - Continue Amitiza prn   - UDS/treatment agreement up to date     2. Right Foot Pain   - No complain on today's visit. No MRI evidence of L5 or S1 nerve root compression - with evidence of pain on weight bearing pain likely non-spinal in nature  - Follow up local podiatrist for evaluation and management, referred on previous visit  - Monitor for any changes or worsening of symptoms.    3. Opioid induced constipation   Improved.   - Note improvement with the consumption with prunes.  - No longer on Amitiza    The patient has an active treatment agreement on file, the patient's last urine toxicology screen was appropriate and it was within the last 12 months, patient brought medications available for pill counts to today's visit and the pill counts were appropriate, the [place] database was reviewed and is appropriate, the patient denies any misuse abuse or diversion of medication and reports an medication significantly improve patient's quality of life as well as functionality status. The patient denies any medication associated side effects and wishes to continue on medications as currently prescribed. Risks and benefits of opiate medications were once again discussed with patient and patient describes her understanding.    I have reviewed the [facility] Medical Board statement on use of controlled substances for the treatment of pain as well as the CDC Guideline for Prescribing Opioids for Chronic Pain. I have reviewed the [place] Controlled Substance Monitoring Database.    Medication Monitoring:   PDMP reviewed: 08/31/23  Last urine toxicology: 12/01/22 - appropriate, sample given 3/20  Last narcotic agreement: 12/01/22  Previous Compliance Issues: Did not bring Oxycodone for pill count 04/03/18, UDS inappropriately positive for hydrocodone on 02/21/20   Naloxone ordered: Yes  Total morphine equivalents: > 300  Benzodiazepine: No  ORT: 0 on 04/03/18    Instruct the patient to follow up as needed for  any changes in symptoms or concerns. Encourage attendance at scheduled appointments with specialists for comprehensive care.    Requested Prescriptions     Signed Prescriptions Disp Refills    oxyCODONE (ROXICODONE) 10 mg immediate release tablet 120 tablet 0     Sig: Take 1 tablet (10 mg total) by mouth every six (6) hours as needed for pain. DNF 09/11/23    oxyCODONE (ROXICODONE) 10 mg immediate release tablet 120 tablet 0     Sig: Take 1 tablet (10 mg total) by mouth every six (6) hours as needed for pain. DNF 10/11/23    oxyCODONE (ROXICODONE) 10 mg immediate release tablet 120 tablet 0     Sig: Take 1 tablet (10 mg total) by mouth every six (6) hours as needed for pain. DNF 11/10/23    oxyCODONE (OXYCONTIN) 60 mg 12 hr crush resistant ER/CR tablet 90 tablet 0     Sig: Take 1 tablet (60 mg total) by mouth every eight (8) hours. DNF 09/11/23    oxyCODONE (OXYCONTIN) 60 mg 12 hr crush resistant ER/CR tablet 90 tablet 0     Sig: Take 1 tablet (60 mg total) by mouth every eight (8) hours. DNF 10/11/23    oxyCODONE (OXYCONTIN) 60 mg 12 hr crush resistant ER/CR tablet 90 tablet 0     Sig: Take 1 tablet (60 mg total) by mouth every eight (8) hours. DNF 11/10/23     No orders of the defined types were placed in this encounter.    HPI:  Carla Evans is a 62 y.o. being followed at [facility] for complaint of chronic lower back pain 2/2 lumbar post-laminectomy syndrome.     The patient was last seen in September, when the patient presented reporting overall unchanged pain since the last visit. She denied any new or worsening symptoms. She stated she planned to see a spine surgeon to discuss options but had been postponing due to car issues. In terms of medications, she endorsed benefit from her current medication regimen without any adverse side effects.    Since last visit, no notable history.     Today, the patient presents reporting overall unchanged pain location and quality since last visit and presents primarily for medication refills. She denies any new or worsening symptoms. In terms of medications, she reports her pain is well controlled on current medication regimen without any adverse side effects.  Of note, pt overused oxycodone, she states she was started on a new medication from dermatology that caused severe psoriatic flare. Discussed with pt not over taking medications without communication and counseled the importance of taking mediations only as prescribed, she verbalizes understanding.     Current medication regimen:  - Oxycontin 60 mg TID  - Oxycodone 10 mg q6h PRN (4x daily)  - Tizanidine 4 mg q8h prn   - Amitiza every day - occasionally     Current view: Showing all answers           [facility] Return Patient Questionnaire       Question 08/30/2023  2:17 PM EST - Filed by Patient    What is the reason for your visit? Medication Refill    Date of onset of your pain: 08/01/1998    Please rate your pain at its WORST in the past month. 10    Please rate your pain at its LEAST in the past month. 7    Please rate your pain as it  is RIGHT NOW. 8    Please rate your pain on AVERAGE in the past month. 10    Please circle the location of your pain.       Please select the words that describe your pain. Aching     Burning     Pressing     Sharp     shooting     Stabbing Tender Throbbing    How often do you have pain? All the time    When is your pain the worst? Mornings    Which of the following have been negatively affected by your pain? Enjoyment of life     General activity     Mood     Normal work     Recreational activities     Sleep     Walking     Sitting     Standing    Since your last visit:     Have you had any of the following? New pain medication prescribed    Do you have any new pain you would like to discuss with your doctor? No    How has your pain changed? Worse    Are you currently taking any blood-thinners or anticoagulants? No    If you are on Pain Medication - Are you having any of the following? Confusion     Dry mouth     Change in Sleep Patterns     Agitation     Dizziness     I am stable on my current medication regime     My medications help to improve my quality of live    If you have had a procedure since your last visit, how much pain relief was obtained?       If you have had a procedure since your last visit, were there any complications?       General: Weight Loss/Gain     Night Sweats     Fatigue     Difficulty Sleeping     Recent Fever or Chills    Cardiovascular: Chest Pain     Shortness of Breath with Exertion     Difficulty Breathing when Laying Flat     Swelling of Ankles/Legs    Gastrointestinal - (Intestinal): Heartburn     Gastric Reflux    Skin: Rash     Skin changes     Redness     Itching     Dryness    Endocrine (Hormonal System): Increased Sweating     Excess Thirst     Hot Flashes     Excess Urination     Change in hair or skin texture     Thinning hair     Intolerance to heat or cold     Decreased sex drive     Decreased sexual performance     Difficulties with Orgasm (Delayed or None)    Musculoskeletal System - (Muscles, Joints and Coverings): Joint Aches/Swelling     Spasms/Spasticity/Cramps     Back pain     Muscle Aches/Weakness    Neurologic: Dizziness/Vertigo     Coordination Difficulty     Headaches/Migraines     Numbness/Tingling    Psychiatric: Anxiety     Low Concentration     Low Energy     Lack of interest in activities     Feeling Hopeless/Helpless          [facility] Patient-Entered [facility]pi Selection Questionnaire  Question 08/30/2023  2:22 PM EST - Filed by Patient    What is the primary reason for your visit? Back Pain    Your back pain Has lasted for 3 months or more    When did you first notice your back pain? In the past 7 days    How often do you feel back pain? Constantly    Since you first noticed your back pain, how has it changed? Gradually worsening    Where is your back pain located? Buttocks     Lower back - above the waist     Lower back - below the waist     Upper back    How would you describe your back pain? Stabbing    Where does your back pain spread? Left foot     Right foot    On a scale of 0 to 10 (10 being the worst), how strong is your back pain? 10    Your back pain is... worse during the day    What makes your back pain worse? Bending     Coughing     Certain positions     Lying down     Sitting     Standing     Stress     Twisting    When do you feel stiffness in your back? In the morning    Are you experiencing any of the following symptoms with your back pain?     Abdominal pain No    Bladder incontinence Yes    Bowel incontinence No    Chest pain Yes    Fever No    Headaches Yes    Leg pain Yes    Numbness Yes    Numbness around the anus No    Painful urination No    Paralysis No    Pelvic pain       Pins and needles Yes    Tingling Yes    Weakness Yes    Weight loss Yes    Do any of the following apply to you? History of cancer     Menopause     Poor posture     Inactive lifestyle        Pt marked off chest pain in questionnaire, denies chest pain.   Patient denies homicidal/suicidal ideation.    Allergies  Allergies   Allergen Reactions    Celebrex [Celecoxib] Swelling    Latex Shortness Of Breath and Rash    Latex, Natural Rubber Shortness Of Breath and Rash    Vioxx [Rofecoxib] Swelling     Severe facial swelling. my face swelled up like a basketball    Xtampza Er [Oxycodone Myristate] Swelling     throat started to close burned neck    Augmentin [Amoxicillin-Pot Clavulanate] Rash    Other      Due to eczema on hands, unable to touch raw fruits and vegetables and most perfume soaps and lotions. Causes blisters, breaking of skin, and bleeding.   Pt reports she can eat raw friut and vegetables, just can't touch them with her hands.    Adhesive Rash     Home Medications    Current Outpatient Medications   Medication Sig Dispense Refill    albuterol (PROVENTIL HFA;VENTOLIN HFA) 90 mcg/actuation inhaler Inhale 2 puffs daily as needed.      apremilast (OTEZLA STARTER) 10 mg (4)-20 mg (4)-30 mg (47) DsPk Take 10mg  once on day 1 (d1), 10mg  2x/day (d2), 10mg   in AM and 20mg  in PM (d3), 20mg  2x/day (d4), 20mg  in AM and 30mg  in PM (d5), and 30mg  2x/day after that 55 tablet 0    apremilast (OTEZLA) 30 mg Tab Take 1 tablet by mouth twice a day to treat rash on hands 60 tablet 11    cetirizine (ZYRTEC) 10 MG tablet Take 1 tablet (10 mg total) by mouth daily.      clobetasol (TEMOVATE) 0.05 % ointment Apply topically two (2) times a day. 30 g 2    COURIERED MED OR SUPPLY [id number], Dupixent, 300mg /59ml, subcutaneous injection.   [id number], Sharps Container, 1 box. 1 each 0    cyanocobalamin, vitamin B-12, 1,000 mcg/mL injection Frequency:Q2W   Dosage:1   ML  Instructions:  Note:Dose:      dupilumab 300 mg/2 mL PnIj Inject the contents of 1 pen (300 mg) under the skin every fourteen (14) days to treat rash. 4 mL 11    dupilumab 300 mg/2 mL PnIj Inject the contents of 2 pens (600mg ) under the skin for loading dose 6 mL 0    empty container Misc Use as directed 1 each 3    fluticasone propionate (FLONASE) 50 mcg/actuation nasal spray 1 spray into each nostril daily.      fluticasone propionate (FLOVENT HFA) 220 mcg/actuation inhaler Inhale 2 puffs daily as needed. Frequency:BID   Dosage:220   MCG  Instructions:  Note:Dose:      hydrOXYzine (ATARAX) 25 MG tablet Take 1 tablet (25 mg total) by mouth two (2) times a day as needed. 180 tablet 0    ibuprofen (ADVIL,MOTRIN) 200 MG tablet Take 1 tablet (200 mg total) by mouth every six (6) hours as needed for pain. 2 tabs as needed      lubiprostone (AMITIZA) 24 MCG capsule Take 1 capsule (24 mcg total) by mouth daily as needed. 90 capsule 0    naloxone (NARCAN) 4 mg nasal spray One spray in either nostril once for known/suspected opioid overdose. May repeat every 2-3 minutes in alternating nostril til EMS arrives 2 each 0    oxyCODONE (OXYCONTIN) 60 mg 12 hr crush resistant ER/CR tablet Take 1 tablet (60 mg total) by mouth every eight (8) hours. DNF 06/13/23 90 tablet 0    oxyCODONE (OXYCONTIN) 60 mg 12 hr crush resistant ER/CR tablet Take 1 tablet (60 mg total) by mouth every eight (8) hours. DNF 07/13/23 90 tablet 0    oxyCODONE (OXYCONTIN) 60 mg 12 hr crush resistant ER/CR tablet Take 1 tablet (60 mg total) by mouth every eight (8) hours. DNF 08/12/23 90 tablet 0    oxyCODONE (ROXICODONE) 10 mg immediate release tablet Take 1 tablet (10 mg total) by mouth every six (6) hours as needed for pain. 120 tablet 0    oxyCODONE (ROXICODONE) 10 mg immediate release tablet Take 1 tablet (10 mg total) by mouth every six (6) hours as needed for pain. DNF 07/13/23 120 tablet 0    oxyCODONE (ROXICODONE) 10 mg immediate release tablet Take 1 tablet (10 mg total) by mouth every six (6) hours as needed for pain. DNF 08/12/23 120 tablet 0    pantoprazole (PROTONIX) 40 MG tablet Take 1 tablet (40 mg total) by mouth daily.      promethazine (PHENERGAN) 25 MG tablet Take 1 tablet (25 mg total) by mouth once as needed.      salmeterol (SEREVENT) 50 mcg/dose diskus inhaler Inhale 1 puff.      tiotropium (SPIRIVA) 18 mcg inhalation capsule  Place 1 capsule (18 mcg total) into inhaler and inhale.      tizanidine (ZANAFLEX) 4 MG tablet Take 1 tablet (4 mg total) by mouth every eight (8) hours as needed. 90 tablet 2     No current facility-administered medications for this visit.     Imaging:  MRI Cervical Spine 10/07/22  Impression   Moderate to marked multilevel degenerative changes throughout the cervical spine, most prominently;   1. Severe spinal canal narrowing and moderate bilateral neuroforaminal narrowing at the C4-5 level. Mildly increased T2 signal abnormality of the spinal cord at this level, possibly reflecting myomalacia.   2. Moderate spinal canal narrowing at C3-4 and C5-6.       MRI Lumbar Spine 10/07/22  Postsurgical changes from laminectomy at L4-L5 and L5-S1, and posterior spinal fusion hardware from L4-L5, with associated metal artifact.      Multilevel degenerative changes, most prominently severe spinal canal narrowing and moderate bilateral neuroforaminal narrowing at the L3-L4 level. Additional details above.        Narrative   EXAM: Magnetic resonance imaging, spinal canal and contents, lumbar, without contrast material.   DATE: 10/07/2022 4:03 PM   ACCESSION: [id number]   DICTATED: 10/07/2022 4:20 PM   INTERPRETATION LOCATION: [facility] IC      CLINICAL INDICATION: 62 years old Female with Back pain/LLE symptoms worsening; concern for worsening stenosis  - M96.1 - Lumbar post - laminectomy syndrome - M54.16 - Lumbar radiculopathy        COMPARISON: MRI lumbar spine 01/29/2015      TECHNIQUE: Multiplanar MRI was performed through the lumbar spine without intravenous contrast.      FINDINGS:   Posterior spinal fusion hardware at the L4 and L5 level with associated metal artifact. Postlaminectomy changes at L4-L5 and L5-S1.      Degenerative bone marrow signal at the L3-L4 level. The visualized cord is unremarkable and the conus medullaris ends at a normal level.      Grade 1 retrolisthesis L2 on L3 and grade 1 anterolisthesis L3 on L4. Multilevel disc desiccation.          T12-L1: Facet arthropathy. No significant spinal canal or neural foraminal narrowing.      L1-L2: Ligamentum flavum hypertrophy and facet arthropathy. No significant spinal canal or neural foraminal narrowing.      L2-L3: Posterior disc bulge, ligamentum flavum hypertrophy, and facet arthropathy. Moderate spinal canal narrowing. Mild bilateral neuroforaminal narrowing.      L3-L4: Posterior disc bulge, ligamentum flavum hypertrophy, and facet arthropathy. Severe spinal canal narrowing, with crowding of the cauda equina nerve roots. Moderate bilateral neuroforaminal narrowing.      L4-L5: Postsurgical changes. No significant spinal canal narrowing. Mild bilateral neuroforaminal narrowing.      L5-S1: Posterior disc bulge and postsurgical changes. No significant spinal canal narrowing. Mild right neuroforaminal narrowing. Moderate left neuroforaminal narrowing.      The paraspinal tissues are within normal limits.      For the purposes of this dictation, the lowest well formed intervertebral disc space is assumed to be the L5-S1 level, and there are presumed to be five lumbar-type vertebral bodies.          Review Of Systems:  See questionnaires     Physical Exam:  VITALS:   Vitals:    08/30/23 1450   BP: 144/76   Pulse: 85   Resp: 16   SpO2: 96%     Wt Readings from Last 3 Encounters:   08/30/23 70.8 kg (  156 lb)   06/13/23 72.4 kg (159 lb 11.2 oz)   03/03/23 70.9 kg (156 lb 6.4 oz)     GENERAL:  The patient is well developed, well-nourished, and appears to be in no apparent distress.   HEAD/NECK:    Normocephalic/atraumatic. clear sclera, pupils not pinpoint  CV:  Warm and well perfused   LUNGS:   Normal work of breathing, no supplemental O2  EXTREMITIES:  No clubbing, cyanosis noted.  NEUROLOGIC:    The patient is alert and oriented, speech fluent, normal language.   MUSCULOSKELETAL:    Motor function  preserved.   GAIT:  The patient rises from a seated position with no difficulty and ambulates with nonantalgic gait without the assistance of a walking aid.   SKIN:   No obvious rashes, lesions, or erythema.  PSY:   Appropriate affect. No overt pain behaviors. No evidence of psychomotor retardation or agitation, no signs of intoxication.   We are delivering comprehensive, continuous, longitudinal care for this patient with chronic pain.

## 2023-08-30 NOTE — Unmapped (Signed)
Medication:  oxycodone 10mg  tab kvk  SIG:  Take 1 tablet by mouth every 6 hours as needed for pain  Quantity on RX:  # 120  Filled on:  08/12/2023  Pill count today:  # 29, patient states she took extra of this medication because of the pain related to a reaction to another medication from dermatology, dupilumab which is is no longer taking now.    Medication:  oxycontin ER 60mg  Tab PUR  SIG:  Take 1 by mouth every 8 hours for chronic pain  Quantity on RX:  # 90  Filled on:  08/12/2023  Pill count today:  # 33

## 2023-09-09 DIAGNOSIS — M961 Postlaminectomy syndrome, not elsewhere classified: Principal | ICD-10-CM

## 2023-09-09 MED ORDER — OXYCODONE ER 60 MG TABLET,CRUSH RESISTANT,EXTENDED RELEASE 12 HR
ORAL_TABLET | Freq: Three times a day (TID) | ORAL | 0 refills | 30.00 days | Status: CP
Start: 2023-09-09 — End: ?

## 2023-09-09 MED ORDER — OXYCODONE 10 MG TABLET
ORAL_TABLET | Freq: Four times a day (QID) | ORAL | 0 refills | 30.00 days | Status: CP | PRN
Start: 2023-09-09 — End: ?
  Filled 2023-09-09: qty 120, 30d supply, fill #0

## 2023-09-09 MED FILL — OXYCONTIN 60 MG TABLET,CRUSH RESISTANT,EXTENDED RELEASE: ORAL | 30 days supply | Qty: 90 | Fill #0

## 2023-09-10 NOTE — Unmapped (Signed)
Patient notified that medication was sent in to the pharmacy.

## 2023-09-11 MED ORDER — OXYCODONE 10 MG TABLET
ORAL_TABLET | Freq: Four times a day (QID) | ORAL | 0 refills | 30.00 days | Status: CP | PRN
Start: 2023-09-11 — End: ?

## 2023-09-11 MED ORDER — OXYCODONE ER 60 MG TABLET,CRUSH RESISTANT,EXTENDED RELEASE 12 HR
ORAL_TABLET | Freq: Three times a day (TID) | ORAL | 0 refills | 30.00 days | Status: CP
Start: 2023-09-11 — End: ?

## 2023-09-26 NOTE — Unmapped (Signed)
Carla Evans (Key: [id number])  PA Case ID #: [id number]  Need Help? Call us at [phone]  Outcome  Additional Information Required  This medication or product was previously approved on [id number] from 2023-09-14 to 2024-09-12. You will be able to fill a prescription for this medication at your pharmacy. If your pharmacy has questions regarding the processing of your prescription or if the request is for Prospective Milligram Morphine Equivalent (MME) review, please call the [facility] pharmacy help desk at [phone] for further assistance.  Drug  oxyCODONE HCl 10MG  tablets    Form  [facility] Medicare Part D Electronic Prior Authorization Form (2017 [place]CPDP)

## 2023-09-26 NOTE — Unmapped (Signed)
Arleda Lahiff (Key: [id number])  PA Case ID #: [id number]  Need Help? Call us at [phone]  Outcome  Additional Information Required  This medication or product was previously approved on [id number] from 2023-09-14 to 2024-09-12. You will be able to fill a prescription for this medication at your pharmacy. If your pharmacy has questions regarding the processing of your prescription or if the request is for Prospective Milligram Morphine Equivalent (MME) review, please call the [facility] pharmacy help desk at [phone] for further assistance.  Drug  OxyCONTIN 60MG  er tablets    Form  [facility] Medicare Part D Electronic Prior Authorization Form (2017 NCPDP)

## 2023-10-11 MED ORDER — OXYCODONE 10 MG TABLET
ORAL_TABLET | Freq: Four times a day (QID) | ORAL | 0 refills | 30.00 days | Status: CP | PRN
Start: 2023-10-11 — End: ?

## 2023-10-11 MED ORDER — OXYCODONE ER 60 MG TABLET,CRUSH RESISTANT,EXTENDED RELEASE 12 HR
ORAL_TABLET | Freq: Three times a day (TID) | ORAL | 0 refills | 30.00 days | Status: CP
Start: 2023-10-11 — End: ?

## 2023-11-10 MED ORDER — OXYCODONE 10 MG TABLET
ORAL_TABLET | Freq: Four times a day (QID) | ORAL | 0 refills | 30.00 days | Status: CP | PRN
Start: 2023-11-10 — End: ?

## 2023-11-10 MED ORDER — OXYCODONE ER 60 MG TABLET,CRUSH RESISTANT,EXTENDED RELEASE 12 HR
ORAL_TABLET | Freq: Three times a day (TID) | ORAL | 0 refills | 30.00 days | Status: CP
Start: 2023-11-10 — End: ?

## 2023-12-01 DIAGNOSIS — M961 Postlaminectomy syndrome, not elsewhere classified: Principal | ICD-10-CM

## 2023-12-05 MED ORDER — OXYCODONE 10 MG TABLET
ORAL_TABLET | Freq: Four times a day (QID) | ORAL | 0 refills | 30.00 days | Status: CP | PRN
Start: 2023-12-05 — End: ?

## 2023-12-10 MED ORDER — OXYCODONE ER 60 MG TABLET,CRUSH RESISTANT,EXTENDED RELEASE 12 HR
ORAL_TABLET | Freq: Three times a day (TID) | ORAL | 0 refills | 30.00 days | Status: CP
Start: 2023-12-10 — End: ?

## 2023-12-26 ENCOUNTER — Ambulatory Visit: Admit: 2023-12-26 | Discharge: 2023-12-27 | Payer: Medicare (Managed Care)

## 2023-12-26 DIAGNOSIS — M961 Postlaminectomy syndrome, not elsewhere classified: Principal | ICD-10-CM

## 2023-12-26 DIAGNOSIS — Z0289 Encounter for other administrative examinations: Principal | ICD-10-CM

## 2023-12-26 DIAGNOSIS — G894 Chronic pain syndrome: Principal | ICD-10-CM

## 2024-01-05 MED ORDER — OXYCODONE 10 MG TABLET
ORAL_TABLET | Freq: Four times a day (QID) | ORAL | 0 refills | 30.00 days | Status: CP | PRN
Start: 2024-01-05 — End: ?

## 2024-01-06 MED ORDER — OXYCODONE ER 60 MG TABLET,CRUSH RESISTANT,EXTENDED RELEASE 12 HR
ORAL_TABLET | Freq: Three times a day (TID) | ORAL | 0 refills | 30.00000 days | Status: CP
Start: 2024-01-06 — End: ?

## 2024-02-01 DIAGNOSIS — M961 Postlaminectomy syndrome, not elsewhere classified: Principal | ICD-10-CM

## 2024-02-02 DIAGNOSIS — M961 Postlaminectomy syndrome, not elsewhere classified: Principal | ICD-10-CM

## 2024-02-04 MED ORDER — OXYCODONE 10 MG TABLET
ORAL_TABLET | Freq: Four times a day (QID) | ORAL | 0 refills | 30.00000 days | Status: CP | PRN
Start: 2024-02-04 — End: ?

## 2024-02-05 MED ORDER — OXYCODONE ER 60 MG TABLET,CRUSH RESISTANT,EXTENDED RELEASE 12 HR
ORAL_TABLET | Freq: Three times a day (TID) | ORAL | 0 refills | 30.00000 days | Status: CP
Start: 2024-02-05 — End: ?

## 2024-03-05 MED ORDER — OXYCODONE 10 MG TABLET
ORAL_TABLET | Freq: Four times a day (QID) | ORAL | 0 refills | 30.00 days | Status: CP | PRN
Start: 2024-03-05 — End: ?

## 2024-03-06 MED ORDER — OXYCODONE ER 60 MG TABLET,CRUSH RESISTANT,EXTENDED RELEASE 12 HR
ORAL_TABLET | Freq: Three times a day (TID) | ORAL | 0 refills | 30.00 days | Status: CP
Start: 2024-03-06 — End: ?

## 2024-03-27 ENCOUNTER — Ambulatory Visit: Admit: 2024-03-27 | Discharge: 2024-03-28 | Payer: Medicare (Managed Care)

## 2024-03-27 DIAGNOSIS — M5416 Radiculopathy, lumbar region: Principal | ICD-10-CM

## 2024-03-27 DIAGNOSIS — M5412 Radiculopathy, cervical region: Principal | ICD-10-CM

## 2024-03-27 DIAGNOSIS — M542 Cervicalgia: Principal | ICD-10-CM

## 2024-03-27 DIAGNOSIS — G894 Chronic pain syndrome: Principal | ICD-10-CM

## 2024-03-27 DIAGNOSIS — M961 Postlaminectomy syndrome, not elsewhere classified: Principal | ICD-10-CM

## 2024-03-27 MED ORDER — TIZANIDINE 4 MG TABLET
ORAL_TABLET | Freq: Three times a day (TID) | ORAL | 2 refills | 30.00000 days | Status: CP | PRN
Start: 2024-03-27 — End: ?

## 2024-04-02 DIAGNOSIS — M961 Postlaminectomy syndrome, not elsewhere classified: Principal | ICD-10-CM

## 2024-04-03 MED ORDER — OXYCODONE 10 MG TABLET
ORAL_TABLET | Freq: Four times a day (QID) | ORAL | 0 refills | 30.00000 days | Status: CP | PRN
Start: 2024-04-03 — End: 2024-05-03

## 2024-04-03 MED ORDER — OXYCODONE ER 60 MG TABLET,CRUSH RESISTANT,EXTENDED RELEASE 12 HR
ORAL_TABLET | Freq: Three times a day (TID) | ORAL | 0 refills | 30.00000 days | Status: CP
Start: 2024-04-03 — End: ?

## 2024-04-05 MED ORDER — OXYCODONE 10 MG TABLET
ORAL_TABLET | Freq: Four times a day (QID) | ORAL | 0 refills | 30.00000 days | Status: CP | PRN
Start: 2024-04-05 — End: 2024-05-05

## 2024-05-03 MED ORDER — OXYCODONE 10 MG TABLET
ORAL_TABLET | Freq: Four times a day (QID) | ORAL | 0 refills | 30.00000 days | Status: CP | PRN
Start: 2024-05-03 — End: 2024-06-02

## 2024-05-03 MED ORDER — OXYCODONE ER 60 MG TABLET,CRUSH RESISTANT,EXTENDED RELEASE 12 HR
ORAL_TABLET | Freq: Three times a day (TID) | ORAL | 0 refills | 30.00000 days | Status: CP
Start: 2024-05-03 — End: ?

## 2024-05-05 MED ORDER — OXYCODONE 10 MG TABLET
ORAL_TABLET | Freq: Four times a day (QID) | ORAL | 0 refills | 30.00000 days | Status: CP | PRN
Start: 2024-05-05 — End: 2024-06-04

## 2024-06-02 MED ORDER — OXYCODONE 10 MG TABLET
ORAL_TABLET | Freq: Four times a day (QID) | ORAL | 0 refills | 30.00000 days | Status: CP | PRN
Start: 2024-06-02 — End: 2024-07-02

## 2024-06-02 MED ORDER — OXYCODONE ER 60 MG TABLET,CRUSH RESISTANT,EXTENDED RELEASE 12 HR
ORAL_TABLET | Freq: Three times a day (TID) | ORAL | 0 refills | 30.00000 days | Status: CP
Start: 2024-06-02 — End: ?

## 2024-06-04 MED ORDER — OXYCODONE 10 MG TABLET
ORAL_TABLET | Freq: Four times a day (QID) | ORAL | 0 refills | 30.00000 days | Status: CP | PRN
Start: 2024-06-04 — End: 2024-07-04

## 2024-06-19 ENCOUNTER — Ambulatory Visit: Admit: 2024-06-19 | Discharge: 2024-06-20 | Payer: Medicare (Managed Care)

## 2024-06-19 DIAGNOSIS — Z79891 Long term (current) use of opiate analgesic: Principal | ICD-10-CM

## 2024-06-19 DIAGNOSIS — M48061 Spinal stenosis, lumbar region without neurogenic claudication: Principal | ICD-10-CM

## 2024-06-19 DIAGNOSIS — M4802 Spinal stenosis, cervical region: Principal | ICD-10-CM

## 2024-06-19 DIAGNOSIS — M961 Postlaminectomy syndrome, not elsewhere classified: Principal | ICD-10-CM

## 2024-06-19 MED ORDER — NALOXONE 4 MG/ACTUATION NASAL SPRAY
NASAL | 0 refills | 0.00000 days | Status: CP
Start: 2024-06-19 — End: ?

## 2024-06-20 DIAGNOSIS — G894 Chronic pain syndrome: Principal | ICD-10-CM

## 2024-07-02 MED ORDER — OXYCODONE ER 60 MG TABLET,CRUSH RESISTANT,EXTENDED RELEASE 12 HR
ORAL_TABLET | Freq: Three times a day (TID) | ORAL | 0 refills | 30.00000 days | Status: CP
Start: 2024-07-02 — End: ?

## 2024-07-02 MED ORDER — OXYCODONE 10 MG TABLET
ORAL_TABLET | Freq: Four times a day (QID) | ORAL | 0 refills | 30.00000 days | Status: CP | PRN
Start: 2024-07-02 — End: 2024-08-01

## 2024-08-01 MED ORDER — OXYCODONE 10 MG TABLET
ORAL_TABLET | Freq: Four times a day (QID) | ORAL | 0 refills | 30.00000 days | Status: CP | PRN
Start: 2024-08-01 — End: 2024-08-31

## 2024-08-01 MED ORDER — OXYCODONE ER 60 MG TABLET,CRUSH RESISTANT,EXTENDED RELEASE 12 HR
ORAL_TABLET | Freq: Three times a day (TID) | ORAL | 0 refills | 30.00000 days | Status: CP
Start: 2024-08-01 — End: ?

## 2024-08-31 MED ORDER — OXYCODONE ER 60 MG TABLET,CRUSH RESISTANT,EXTENDED RELEASE 12 HR
ORAL_TABLET | Freq: Three times a day (TID) | ORAL | 0 refills | 30.00000 days | Status: CP
Start: 2024-08-31 — End: ?

## 2024-08-31 MED ORDER — OXYCODONE 10 MG TABLET
ORAL_TABLET | Freq: Four times a day (QID) | ORAL | 0 refills | 30.00000 days | Status: CP | PRN
Start: 2024-08-31 — End: 2024-09-30

## 2024-09-18 ENCOUNTER — Ambulatory Visit: Admit: 2024-09-18 | Discharge: 2024-09-19 | Payer: Medicare (Managed Care)

## 2024-09-18 DIAGNOSIS — M961 Postlaminectomy syndrome, not elsewhere classified: Principal | ICD-10-CM

## 2024-09-30 MED ORDER — OXYCODONE ER 60 MG TABLET,CRUSH RESISTANT,EXTENDED RELEASE 12 HR
ORAL_TABLET | Freq: Three times a day (TID) | ORAL | 0 refills | 30.00000 days | Status: CP
Start: 2024-09-30 — End: ?

## 2024-09-30 MED ORDER — OXYCODONE 10 MG TABLET
ORAL_TABLET | Freq: Four times a day (QID) | ORAL | 0 refills | 30.00000 days | Status: CP | PRN
Start: 2024-09-30 — End: 2024-10-30

## 2024-10-30 MED ORDER — OXYCODONE ER 60 MG TABLET,CRUSH RESISTANT,EXTENDED RELEASE 12 HR
ORAL_TABLET | Freq: Three times a day (TID) | ORAL | 0 refills | 30.00000 days | Status: CP
Start: 2024-10-30 — End: ?

## 2024-10-30 MED ORDER — OXYCODONE 10 MG TABLET
ORAL_TABLET | Freq: Four times a day (QID) | ORAL | 0 refills | 30.00000 days | Status: CP | PRN
Start: 2024-10-30 — End: 2024-11-29

## 2024-11-29 MED ORDER — OXYCODONE 10 MG TABLET
ORAL_TABLET | Freq: Four times a day (QID) | ORAL | 0 refills | 30.00000 days | Status: CP | PRN
Start: 2024-11-29 — End: ?

## 2024-11-29 MED ORDER — OXYCODONE ER 60 MG TABLET,CRUSH RESISTANT,EXTENDED RELEASE 12 HR
ORAL_TABLET | Freq: Three times a day (TID) | ORAL | 0 refills | 30.00000 days | Status: CP
Start: 2024-11-29 — End: ?
